# Patient Record
Sex: Female | Born: 2000 | Race: White | Hispanic: No | Marital: Married | Smoking: Never smoker
Health system: Southern US, Community
[De-identification: ages and names within clinical notes are randomized; demographics above are authoritative.]

## PROBLEM LIST (undated history)

## (undated) DIAGNOSIS — N200 Calculus of kidney: Secondary | ICD-10-CM

## (undated) HISTORY — DX: Calculus of kidney: N20.0

## (undated) HISTORY — PX: NO PAST SURGERIES: SHX2092

---
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
## 2018-07-09 ENCOUNTER — Other Ambulatory Visit: Payer: Self-pay

## 2018-07-09 ENCOUNTER — Emergency Department
Admission: EM | Admit: 2018-07-09 | Discharge: 2018-07-09 | Disposition: A | Discharge status: Home / Self Care | Payer: Self-pay | Attending: Emergency Medicine | Admitting: Emergency Medicine

## 2018-07-09 ENCOUNTER — Encounter: Payer: Self-pay | Admitting: Emergency Medicine

## 2018-07-09 DIAGNOSIS — Z3202 Encounter for pregnancy test, result negative: Secondary | ICD-10-CM | POA: Insufficient documentation

## 2018-07-09 DIAGNOSIS — N920 Excessive and frequent menstruation with regular cycle: Secondary | ICD-10-CM | POA: Insufficient documentation

## 2018-07-09 DIAGNOSIS — Z793 Long term (current) use of hormonal contraceptives: Secondary | ICD-10-CM | POA: Insufficient documentation

## 2018-07-09 LAB — BASIC METABOLIC PANEL
Anion gap: 7 (ref 5–15)
BUN: 9 mg/dL (ref 6–20)
CO2: 23 mmol/L (ref 22–32)
Calcium: 8.8 mg/dL — ABNORMAL LOW (ref 8.9–10.3)
Chloride: 106 mmol/L (ref 98–111)
Creatinine, Ser: 0.6 mg/dL (ref 0.44–1.00)
GFR calc non Af Amer: >60 mL/min (ref >60)
Glucose, Bld: 114 mg/dL — ABNORMAL HIGH (ref 70–99)
Potassium: 3.6 mmol/L (ref 3.5–5.1)
SODIUM: 136 mmol/L (ref 135–145)

## 2018-07-09 LAB — CBC WITH DIFFERENTIAL/PLATELET
Abs Immature Granulocytes: 0.02 10*3/uL (ref 0.00–0.07)
BASOS PCT: 0 %
Basophils Absolute: 0 10*3/uL (ref 0.0–0.1)
Eosinophils Absolute: 0.1 10*3/uL (ref 0.0–0.5)
Eosinophils Relative: 1 %
HCT: 40.3 % (ref 36.0–46.0)
Hemoglobin: 13.4 g/dL (ref 12.0–15.0)
Immature Granulocytes: 0 %
Lymphocytes Relative: 40 %
Lymphs Abs: 2 10*3/uL (ref 0.7–4.0)
MCH: 28.9 pg (ref 26.0–34.0)
MCHC: 33.3 g/dL (ref 30.0–36.0)
MCV: 86.9 fL (ref 80.0–100.0)
Monocytes Absolute: 0.4 10*3/uL (ref 0.1–1.0)
Monocytes Relative: 8 %
NRBC: 0 % (ref 0.0–0.2)
Neutro Abs: 2.5 10*3/uL (ref 1.7–7.7)
Neutrophils Relative %: 51 %
PLATELETS: 209 10*3/uL (ref 150–400)
RBC: 4.64 MIL/uL (ref 3.87–5.11)
RDW: 11.6 % (ref 11.5–15.5)
WBC: 5.1 10*3/uL (ref 4.0–10.5)

## 2018-07-09 LAB — POCT PREGNANCY, URINE: Preg Test, Ur: NEGATIVE

## 2018-07-09 NOTE — ED Provider Notes (Signed)
[facility] Emergency Department Provider Note   ____________________________________________    I have reviewed the triage vital signs and the nursing notes.   HISTORY  Chief Complaint Vaginal Bleeding     HPI Marissa Kennedy is a 17 y.o. female who presents with complaints of vaginal bleeding.  Patient reports she is currently menstruating but notes over the last several months her menstrual cycles have been heavier than she is used to and occasionally she passes clots.  She reports that she is on oral contraceptive pills and ever since she has been on them for the last several months she has had heavier bleeding.  No dizziness or lightheadedness or abdominal pain  Past Medical History:  Diagnosis Date  . Calculus of kidney 2017   DR. LARRY SYKES    There are no active problems to display for this patient.   Past Surgical History:  Procedure Laterality Date  . NO PAST SURGERIES      Prior to Admission medications   Medication Sig Start Date End Date Taking? Authorizing Provider  MedroxyPROGESTERone Acetate 150 MG/ML SUSY Inject 1 mL (150 mg total) into the muscle once. 02/10/17 02/10/17  Copland, Ilona SorrelAlicia B, PA-C     Allergies Patient has no known allergies.  Family History  Problem Relation Age of Onset  . Diabetes Father        TYPE 2    Social History Social History   Tobacco Use  . Smoking status: Never Smoker  . Smokeless tobacco: Never Used  Substance Use Topics  . Alcohol use: No  . Drug use: No    Review of Systems  Constitutional: No fever/chills Eyes: No visual changes.  ENT: No sore throat. Cardiovascular: Denies chest pain. Respiratory: Denies shortness of breath. Gastrointestinal: No abdominal pain.  No nausea, no vomiting.   Genitourinary: As above Musculoskeletal: Negative for back pain. Skin: Negative for rash. Neurological: Negative for headaches or  weakness   ____________________________________________   PHYSICAL EXAM:  VITAL SIGNS: ED Triage Vitals  Enc Vitals Group     BP 07/09/18 1152 125/83     Pulse Rate 07/09/18 1152 94     Resp --      Temp 07/09/18 1152 98.3 F (36.8 C)     Temp Source 07/09/18 1152 Oral     SpO2 07/09/18 1152 98 %     Weight 07/09/18 1153 70.3 kg (155 lb)     Height 07/09/18 1153 1.6 m (5\' 3" )     Head Circumference --      Peak Flow --      Pain Score 07/09/18 1154 7     Pain Loc --      Pain Edu? --      Excl. in GC? --     Constitutional: Alert and oriented. No acute distress.  Nose: No congestion/rhinnorhea. Mouth/Throat: Mucous membranes are moist.    Cardiovascular: Normal rate, regular rhythm.   Good peripheral circulation. Respiratory: Normal respiratory effort.  No retractions Gastrointestinal: Soft and nontender. No distention.  Musculoskeletal: No lower extremity tenderness nor edema.  Warm and well perfused Neurologic:  Normal speech and language. No gross focal neurologic deficits are appreciated.  Skin:  Skin is warm, dry and intact. No rash noted. Psychiatric: Mood and affect are normal. Speech and behavior are normal.  ____________________________________________   LABS (all labs ordered are listed, but only abnormal results are displayed)  Labs Reviewed  BASIC METABOLIC PANEL - Abnormal; Notable for the following  components:      Result Value   Glucose, Bld 114 (*)    Calcium 8.8 (*)    All other components within normal limits  CBC WITH DIFFERENTIAL/PLATELET  POCT PREGNANCY, URINE   ____________________________________________  EKG  None ____________________________________________  RADIOLOGY   ____________________________________________   PROCEDURES  Procedure(s) performed: No  Procedures   Critical Care performed: No ____________________________________________   INITIAL IMPRESSION / ASSESSMENT AND PLAN / ED COURSE  Pertinent labs &  imaging results that were available during my care of the patient were reviewed by me and considered in my medical decision making (see chart for details).  Patient presents with vaginal bleeding during her menstrual cycle.  Pregnancy is negative.  I recommend to the patient that she continue active OCPs instead of taking sugar pills to avoid withdrawal bleeds and follow-up with gynecologist   ____________________________________________   FINAL CLINICAL IMPRESSION(S) / ED DIAGNOSES  Final diagnoses:  Menorrhagia with regular cycle        Note:  This document was prepared using Dragon voice recognition software and may include unintentional dictation errors.    Jene EveryKinner, Rilee Knoll, MD 07/09/18 [phone]

## 2018-07-09 NOTE — ED Triage Notes (Signed)
Pt with heavy vaginal bleeding that started Monday, which was on schedule. Flow increased last night. Pt has used 5 pads since 0300.

## 2018-07-09 NOTE — ED Notes (Signed)
Dr Kinner in to evaluate patient 

## 2018-07-10 ENCOUNTER — Ambulatory Visit (INDEPENDENT_AMBULATORY_CARE_PROVIDER_SITE_OTHER): Payer: Self-pay | Admitting: Obstetrics and Gynecology

## 2018-07-10 ENCOUNTER — Encounter: Payer: Self-pay | Admitting: Obstetrics and Gynecology

## 2018-07-10 VITALS — BP 80/50 | HR 73 | Ht 63.0 in | Wt 157.0 lb

## 2018-07-10 DIAGNOSIS — N920 Excessive and frequent menstruation with regular cycle: Secondary | ICD-10-CM

## 2018-07-10 NOTE — Progress Notes (Signed)
Patient ID: [place] Lions[place]ackenna [place], female   DOB: [birth year], 17 y.o.   MRN: [id number]  Reason for Consult: Follow-up (pelvic pain is still there but not as bad as it was,right now is on the left side only, before it was entire pelvic area, on the 23rd in about 6 hrs changed tampon and pad 5 times )   Referred by No ref. provider found  Subjective:     HPI:  Marissa Kennedy is a 17 y.o. female . She reports that for 3 months she has been having heavy bleeding during her period. She says that the other night she woke up with blood streaming down her leg and she felt it pouring out like she was peeing on herself. She uses tampons and pads. She had to change them every hour for 6 hours. It took her a long time to go to the hospital but when she went she was found to not be pregnant and to have a normal CBC. She denies feeling light headed or dizzy. Her bleeding has lessened and slowed on its own. This bleeding occurred at the normal time for her period.  She lost insurance a year ago. She was taking depo but [place]  She started 6 months ago receiving birth control through an app on her phone. She has been receiving microgestin 1.5/30  Menarche 9 or 10, normal regular periods, no heavy bleeding, once had a episode of pain but she took midol and it was better.  She is sexually active, denies dyspareunia.    Past Medical History:  Diagnosis Date  . Calculus of kidney 2017   DR. LARRY SYKES   Family History  Problem Relation Age of Onset  . Diabetes Father        TYPE 2   Past Surgical History:  Procedure Laterality Date  . NO PAST SURGERIES      Short Social History:  Social History   Tobacco Use  . Smoking status: Never Smoker  . Smokeless tobacco: Never Used  Substance Use Topics  . Alcohol use: No    No Known Allergies  No current outpatient medications on file.   No current facility-administered medications for this visit.     Review of  Systems  Constitutional: Negative for chills, fatigue, fever and unexpected weight change.  HENT: Negative for trouble swallowing.  Eyes: Negative for loss of vision.  Respiratory: Negative for cough, shortness of breath and wheezing.  Cardiovascular: Negative for chest pain, leg swelling, palpitations and syncope.  GI: Negative for abdominal pain, blood in stool, diarrhea, nausea and vomiting.  GU: Negative for difficulty urinating, dysuria, frequency and hematuria.  Musculoskeletal: Negative for back pain, leg pain and joint pain.  Skin: Negative for rash.  Neurological: Negative for dizziness, headaches, light-headedness, numbness and seizures.  Psychiatric: Negative for behavioral problem, confusion, depressed mood and sleep disturbance.        Objective:  Objective   Vitals:   07/10/18 1128  BP: (!) 80/50  Pulse: 73  Weight: 157 lb (71.2 kg)  Height: 5\' 3"  (1.6 m)   Body mass index is 27.81 kg/m.  Physical Exam Vitals signs and nursing note reviewed.  Constitutional:      Appearance: She is well-developed.  HENT:     Head: Normocephalic and atraumatic.  Eyes:     Pupils: Pupils are equal, round, and reactive to light.  Cardiovascular:     Rate and Rhythm: Normal rate and regular rhythm.  Pulmonary:     Effort: Pulmonary effort is normal. No respiratory distress.  Genitourinary:    General: Normal vulva.     Comments: Normal cervix, minimal bleeding, normal 7cm uterus, no CMT, no adnexal masses or adnexal tenderness on examination Skin:    General: Skin is warm and dry.  Neurological:     Mental Status: She is alert and oriented to person, place, and time.  Psychiatric:        Behavior: Behavior normal.        Thought Content: Thought content normal.        Judgment: Judgment normal.        Assessment/Plan:    17 yo with menorrhagia  Given a 3 month supply of Taytulla. Will see if this medication helps improve her menses. 24 days of active pills and lower  amounts of progesterone and estrogen component.  We discussed other option for cycle control such as an IUD, Nexplanon, or depo. Discussed that these would be available to her through the health department at a reduced cost. She is not currently interested in that option, although she may consider.   She will follow up for an ultrasound and a return visit in 3 months if there has been no improvement.   More than 45 minutes were spent face to face with the patient in the room with more than 50% of the time spent providing counseling and discussing the plan of management.    Adelene Idlerhristanna Courtnay Petrilla MD [facility], [facility] 07/10/2018 12:39 PM

## 2018-08-19 ENCOUNTER — Other Ambulatory Visit: Payer: Self-pay

## 2018-08-19 DIAGNOSIS — I880 Nonspecific mesenteric lymphadenitis: Secondary | ICD-10-CM | POA: Insufficient documentation

## 2018-08-19 DIAGNOSIS — Z79899 Other long term (current) drug therapy: Secondary | ICD-10-CM | POA: Insufficient documentation

## 2018-08-19 LAB — URINALYSIS, COMPLETE (UACMP) WITH MICROSCOPIC
Bilirubin Urine: NEGATIVE
Glucose, UA: NEGATIVE mg/dL
Ketones, ur: NEGATIVE mg/dL
Leukocytes, UA: NEGATIVE
Nitrite: NEGATIVE
Protein, ur: NEGATIVE mg/dL
Specific Gravity, Urine: 1.01 (ref 1.005–1.030)
pH: 7 (ref 5.0–8.0)

## 2018-08-19 LAB — COMPREHENSIVE METABOLIC PANEL
ALT: 15 U/L (ref 0–44)
AST: 20 U/L (ref 15–41)
Albumin: 4 g/dL (ref 3.5–5.0)
Alkaline Phosphatase: 29 U/L — ABNORMAL LOW (ref 38–126)
Anion gap: 5 (ref 5–15)
BUN: 6 mg/dL (ref 6–20)
CO2: 26 mmol/L (ref 22–32)
Calcium: 8.3 mg/dL — ABNORMAL LOW (ref 8.9–10.3)
Chloride: 103 mmol/L (ref 98–111)
Creatinine, Ser: 0.59 mg/dL (ref 0.44–1.00)
GFR calc Af Amer: >60 mL/min (ref >60)
GFR calc non Af Amer: >60 mL/min (ref >60)
Glucose, Bld: 99 mg/dL (ref 70–99)
Potassium: 3.3 mmol/L — ABNORMAL LOW (ref 3.5–5.1)
Sodium: 134 mmol/L — ABNORMAL LOW (ref 135–145)
Total Bilirubin: 0.4 mg/dL (ref 0.3–1.2)
Total Protein: 6.9 g/dL (ref 6.5–8.1)

## 2018-08-19 LAB — CBC
HCT: 37.6 % (ref 36.0–46.0)
Hemoglobin: 13 g/dL (ref 12.0–15.0)
MCH: 29.3 pg (ref 26.0–34.0)
MCHC: 34.6 g/dL (ref 30.0–36.0)
MCV: 84.7 fL (ref 80.0–100.0)
PLATELETS: 216 10*3/uL (ref 150–400)
RBC: 4.44 MIL/uL (ref 3.87–5.11)
RDW: 11.4 % — ABNORMAL LOW (ref 11.5–15.5)
WBC: 6 10*3/uL (ref 4.0–10.5)
nRBC: 0 % (ref 0.0–0.2)

## 2018-08-19 LAB — POCT PREGNANCY, URINE: Preg Test, Ur: NEGATIVE

## 2018-08-19 NOTE — ED Triage Notes (Signed)
Pt in with co mid abd pain and back pain. States has hx of kidney stones and feels the same. Denies any dysuria.

## 2018-08-20 ENCOUNTER — Emergency Department
Admission: EM | Admit: 2018-08-20 | Discharge: 2018-08-20 | Disposition: A | Discharge status: Home / Self Care | Payer: Self-pay | Attending: Emergency Medicine | Admitting: Emergency Medicine

## 2018-08-20 ENCOUNTER — Emergency Department: Payer: Self-pay

## 2018-08-20 DIAGNOSIS — I880 Nonspecific mesenteric lymphadenitis: Secondary | ICD-10-CM

## 2018-08-20 LAB — LIPASE, BLOOD: Lipase: 25 U/L (ref 11–51)

## 2018-08-20 MED ORDER — IOPAMIDOL (ISOVUE-300) INJECTION 61%
100.0000 mL | Freq: Once | INTRAVENOUS | Status: AC | PRN
  Administered 2018-08-20: 100 mL via INTRAVENOUS

## 2018-08-20 NOTE — ED Provider Notes (Signed)
[facility] Emergency Department Provider Note __   First MD Initiated Contact with Patient 08/20/18 0304     (approximate)  I have reviewed the triage vital signs and the nursing notes.   HISTORY  Chief Complaint Abdominal Pain   HPI Marissa Kennedy is a 18 y.o. female with previous history of kidney stones.  Presents to the emergency department acute onset of generalized abdominal discomfort described as cramping which began at 4:30 PM.  Patient admits to nausea however no vomiting no diarrhea constipation.  Patient denies any fever.   Past Medical History:  Diagnosis Date  . Calculus of kidney 2017   DR. LARRY SYKES    There are no active problems to display for this patient.   Past Surgical History:  Procedure Laterality Date  . NO PAST SURGERIES      Prior to Admission medications   Medication Sig Start Date End Date Taking? Authorizing Provider  MICROGESTIN FE 1.5/30 1.5-30 MG-MCG tablet TAKE 1 TABLET BY MOUTH DAILY AT THE SAME TIME 05/08/18   [provider]    Allergies Patient has no known allergies.  Family History  Problem Relation Age of Onset  . Diabetes Father        TYPE 2    Social History Social History   Tobacco Use  . Smoking status: Never Smoker  . Smokeless tobacco: Never Used  Substance Use Topics  . Alcohol use: No  . Drug use: No    Review of Systems Constitutional: No fever/chills Eyes: No visual changes. ENT: No sore throat. Cardiovascular: Denies chest pain. Respiratory: Denies shortness of breath. Gastrointestinal: Positive for abdominal pain.  No nausea, no vomiting.  No diarrhea.  No constipation. Genitourinary: Negative for dysuria. Musculoskeletal: Negative for neck pain.  Negative for back pain. Integumentary: Negative for rash. Neurological: Negative for headaches, focal weakness or numbness.   ____________________________________________   PHYSICAL EXAM:  VITAL  SIGNS: ED Triage Vitals [08/19/18 2209]  Enc Vitals Group     BP 120/69     Pulse Rate (!) 118     Resp 20     Temp 99 F (37.2 C)     Temp Source Oral     SpO2 99 %     Weight 72.6 kg (160 lb)     Height 1.6 m (5\' 3" )     Head Circumference      Peak Flow      Pain Score 7     Pain Loc      Pain Edu?      Excl. in GC?     Constitutional: Alert and oriented. Well appearing and in no acute distress. Eyes: Conjunctivae are normal.  Mouth/Throat: Mucous membranes are moist. Oropharynx non-erythematous. Neck: No stridor.   Cardiovascular: Normal rate, regular rhythm. Good peripheral circulation. Grossly normal heart sounds. Respiratory: Normal respiratory effort.  No retractions. Lungs CTAB. Gastrointestinal: Soft and nontender. No distention.  Musculoskeletal: No lower extremity tenderness nor edema. No gross deformities of extremities. Neurologic:  Normal speech and language. No gross focal neurologic deficits are appreciated.  Skin:  Skin is warm, dry and intact. No rash noted. Psychiatric: Mood and affect are normal. Speech and behavior are normal.  ____________________________________________   LABS (all labs ordered are listed, but only abnormal results are displayed)  Labs Reviewed  CBC - Abnormal; Notable for the following components:      Result Value   RDW 11.4 (*)    All other components within  normal limits  COMPREHENSIVE METABOLIC PANEL - Abnormal; Notable for the following components:   Sodium 134 (*)    Potassium 3.3 (*)    Calcium 8.3 (*)    Alkaline Phosphatase 29 (*)    All other components within normal limits  URINALYSIS, COMPLETE (UACMP) WITH MICROSCOPIC - Abnormal; Notable for the following components:   Color, Urine YELLOW (*)    APPearance CLEAR (*)    Hgb urine dipstick SMALL (*)    Bacteria, UA RARE (*)    All other components within normal limits  LIPASE, BLOOD  POC URINE PREG, ED  POCT PREGNANCY, URINE    ______________________  RADIOLOGY I, Guadalupe Guerra N Mardel Grudzien, personally viewed and evaluated these images (plain radiographs) as part of my medical decision making, as well as reviewing the written report by the radiologist.  ED MD interpretation: No appendicitis urolithiasis or any other definitive acute findings noted on CT scan of the abdomen pelvis.    Official radiology report(s): Ct Abdomen Pelvis W Contrast  Result Date: 08/20/2018 CLINICAL DATA:  Mid abdominal pain and back pain EXAM: CT ABDOMEN AND PELVIS WITH CONTRAST TECHNIQUE: Multidetector CT imaging of the abdomen and pelvis was performed using the standard protocol following bolus administration of intravenous contrast. CONTRAST:  ISOVUE-300 IOPAMIDOL (ISOVUE-300) INJECTION 61% COMPARISON:  None. FINDINGS: Lower chest:  Negative Hepatobiliary: No focal liver abnormality.No evidence of biliary obstruction or stone. Pancreas: Unremarkable. Spleen: Unremarkable. Adrenals/Urinary Tract: Negative adrenals. No hydronephrosis or stone. Unremarkable bladder. Stomach/Bowel:  No obstruction. No appendicitis. Vascular/Lymphatic: No acute vascular abnormality. Generalized prominence of mesenteric lymph nodes without associated bowel wall thickening or masslike findings. Reproductive:No pathologic findings. Other: Trace pelvic fluid, often physiologic. Musculoskeletal: No acute abnormalities. IMPRESSION: 1. No appendicitis, urolithiasis, or other definite acute finding. 2. Reactive appearing prominence of mesenteric lymph nodes, please correlate for enteritis symptoms. 3. Trace pelvic fluid, usually physiologic. Electronically Signed   By: Marnee Spring M.D.   On: 08/20/2018 04:43      Procedures   ____________________________________________   INITIAL IMPRESSION / ASSESSMENT AND PLAN / ED COURSE  As part of my medical decision making, I reviewed the following data within the electronic MEDICAL RECORD NUMBER   18 year old female present  with above-stated history and physical exam raising possibility of appendicitis diverticulitis ureterolithiasis versus other potential intra-abdominal pathology.  CT scan revealed no acute process however did reveal mesenteric lymph nodes in the setting of a normal white count.  Suspect mesenteric adenitis as the etiology for the patient's discomfort. ____________________________________________  FINAL CLINICAL IMPRESSION(S) / ED DIAGNOSES  Final diagnoses:  Mesenteric adenitis     MEDICATIONS GIVEN DURING THIS VISIT:  Medications  iopamidol (ISOVUE-300) 61 % injection 100 mL (100 mLs Intravenous Contrast Given 08/20/18 0355)     ED Discharge Orders    None       Note:  This document was prepared using Dragon voice recognition software and may include unintentional dictation errors.   Darci Current, MD 08/20/18 [id number]

## 2018-09-22 ENCOUNTER — Telehealth: Payer: Self-pay

## 2018-09-22 ENCOUNTER — Other Ambulatory Visit: Payer: Self-pay | Admitting: Obstetrics and Gynecology

## 2018-09-22 DIAGNOSIS — Z3009 Encounter for other general counseling and advice on contraception: Secondary | ICD-10-CM

## 2018-09-22 MED ORDER — LEVONORGEST-ETH ESTRAD 91-DAY 0.15-0.03 &0.01 MG PO TABS: 1.0000 | ORAL_TABLET | Freq: Every day | ORAL | 4 refills | Status: DC

## 2018-09-22 NOTE — Telephone Encounter (Signed)
Please advise 

## 2018-09-22 NOTE — Telephone Encounter (Signed)
Pt c/o waking up this morning bleeding.  Periods have been pretty regular c bcp.  Has appt 4/2. Wants to discuss this c someone.  She doesn't have ins and doesn't want to come in until appt on 4/2.  [phone]  Pt hasn't been late taking pill or missed any pills.  She is about a week away from when she is supposed to have period.  Bleeding started out as red, then pink, now pinkish brown.  Is not saturating pad q71min - 1hr but flow does fluctuate.  Adv I would send msg to CRS as I didn't have an answer for her.

## 2018-09-22 NOTE — Telephone Encounter (Signed)
Patient has been happy with Taytulla. Does not have insurance. Has not registered for medicaid or family planning, encouraged her to do this. She says her husband may have insurance in several months with his job. Bleeding has slowed. Recommended continuing with pill pack. This is her last pack of samples she was given. She has been happy with the taytulla. Sent rx for affordable generic medication for extended cycle OCP. Follow up as need. -Eliam Snapp

## 2018-10-09 ENCOUNTER — Ambulatory Visit: Payer: Self-pay | Admitting: Obstetrics and Gynecology

## 2018-10-09 ENCOUNTER — Other Ambulatory Visit: Payer: Self-pay

## 2018-10-12 ENCOUNTER — Other Ambulatory Visit: Payer: Self-pay | Admitting: Obstetrics and Gynecology

## 2018-10-15 ENCOUNTER — Ambulatory Visit: Payer: Self-pay | Admitting: Obstetrics and Gynecology

## 2018-10-15 ENCOUNTER — Ambulatory Visit: Payer: Self-pay

## 2018-10-26 ENCOUNTER — Other Ambulatory Visit: Payer: Self-pay | Admitting: Obstetrics and Gynecology

## 2018-10-26 DIAGNOSIS — N920 Excessive and frequent menstruation with regular cycle: Secondary | ICD-10-CM

## 2018-11-12 ENCOUNTER — Ambulatory Visit: Payer: Self-pay | Admitting: Obstetrics and Gynecology

## 2018-11-12 ENCOUNTER — Other Ambulatory Visit: Payer: Self-pay

## 2018-12-15 ENCOUNTER — Telehealth: Payer: Self-pay

## 2018-12-15 NOTE — Telephone Encounter (Signed)
Pt needs refill of bcp; has no insurance so she can't come in for annual if we can point her in the right direction about that. [phone]

## 2018-12-16 ENCOUNTER — Other Ambulatory Visit: Payer: Self-pay

## 2018-12-16 DIAGNOSIS — Z3009 Encounter for other general counseling and advice on contraception: Secondary | ICD-10-CM

## 2018-12-16 MED ORDER — LEVONORGEST-ETH ESTRAD 91-DAY 0.15-0.03 &0.01 MG PO TABS: 1.0000 | ORAL_TABLET | Freq: Every day | ORAL | 0 refills | Status: DC

## 2018-12-16 NOTE — Telephone Encounter (Signed)
Pt aware of RF sent to pharmacy.

## 2018-12-16 NOTE — Telephone Encounter (Signed)
Called and spoke to patient. Advise appointment is 132.75 for Self pay. Patient is schedule for 12/24/18 with [facility] requesting refill on Birth Control to get to her schedule appointment

## 2018-12-24 ENCOUNTER — Ambulatory Visit: Payer: Self-pay | Admitting: Obstetrics and Gynecology

## 2019-01-25 ENCOUNTER — Ambulatory Visit: Payer: Self-pay | Admitting: Obstetrics and Gynecology

## 2019-01-29 ENCOUNTER — Ambulatory Visit: Payer: Self-pay | Admitting: Obstetrics and Gynecology

## 2019-11-03 NOTE — Progress Notes (Signed)
Chief Complaint  Patient presents with  . Gynecologic Exam     HPI:      [name] is a 19 y.o. G0P0000 who LMP was Patient's last menstrual period was 10/30/2019 (exact date)., presents today for her annual examination.  Her menses are regular every 28-30 days, lasting 4-5 days.  Dysmenorrhea mild, takes NSAIDs with relief. She does not have intermenstrual bleeding.  Sex activity: single partner, contraception - none. Conception ok. Just started PNVs. Keeps getting positive home UPTs if a few days late for period and then has regular menses. Did OCPs in past but didn't like how they made her feel.  Hx of STDs: none  Had episode of severe pelvic pain lasting several hrs about 5 days ago. Sx were on day 1 of period. Went to ED. Had neg serum beta HcG, neg labs. Offered u/s but pt declined because sx resolved. Had had a pos home UPT a few days before bleeding and then subsequent neg UPTs.   There is no FH of breast cancer. There is no FH of ovarian cancer. The patient does not do self-breast exams.  Tobacco use: The patient denies current or previous tobacco use. Alcohol use: social No drug use Exercise: moderately active  She does get adequate calcium and Vitamin D in her diet.  Gard completed.  Past Medical History:  Diagnosis Date  . Calculus of kidney 2017   DR. Zara Chess    Past Surgical History:  Procedure Laterality Date  . NO PAST SURGERIES      Family History  Problem Relation Age of Onset  . Diabetes Father        TYPE 2    Social History   Socioeconomic History  . Marital status: Single    Spouse name: Not on file  . Number of children: 0  . Years of education: 54  . Highest education level: Not on file  Occupational History  . Not on file  Tobacco Use  . Smoking status: Never Smoker  . Smokeless tobacco: Never Used  Substance and Sexual Activity  . Alcohol use: No  . Drug use: No  . Sexual activity: Yes    Birth  control/protection: None  Other Topics Concern  . Not on file  Social History Narrative  . Not on file   Social Determinants of Health   Financial Resource Strain:   . Difficulty of Paying Living Expenses:   Food Insecurity:   . Worried About Programme researcher, broadcasting/film/video in the Last Year:   . Barista in the Last Year:   Transportation Needs:   . Freight forwarder (Medical):   Marland Kitchen Lack of Transportation (Non-Medical):   Physical Activity:   . Days of Exercise per Week:   . Minutes of Exercise per Session:   Stress:   . Feeling of Stress :   Social Connections:   . Frequency of Communication with Friends and Family:   . Frequency of Social Gatherings with Friends and Family:   . Attends Religious Services:   . Active Member of Clubs or Organizations:   . Attends Banker Meetings:   Marland Kitchen Marital Status:   Intimate Partner Violence:   . Fear of Current or Ex-Partner:   . Emotionally Abused:   Marland Kitchen Physically Abused:   . Sexually Abused:     No current outpatient medications on file.  ROS:  Review of Systems  Constitutional: Positive for fatigue. Negative for fever and  unexpected weight change.  Respiratory: Negative for cough, shortness of breath and wheezing.   Cardiovascular: Negative for chest pain, palpitations and leg swelling.  Gastrointestinal: Negative for blood in stool, constipation, diarrhea, nausea and vomiting.  Endocrine: Negative for cold intolerance, heat intolerance and polyuria.  Genitourinary: Negative for dyspareunia, dysuria, flank pain, frequency, genital sores, hematuria, menstrual problem, pelvic pain, urgency, vaginal bleeding, vaginal discharge and vaginal pain.  Musculoskeletal: Negative for back pain, joint swelling and myalgias.  Skin: Negative for rash.  Neurological: Positive for headaches. Negative for dizziness, syncope, light-headedness and numbness.  Hematological: Negative for adenopathy.  Psychiatric/Behavioral: Negative for  agitation, confusion, sleep disturbance and suicidal ideas. The patient is not nervous/anxious.      Objective: BP 90/64   Ht 5\' 3"  (1.6 m)   Wt 169 lb (76.7 kg)   LMP 10/30/2019 (Exact Date)   BMI 29.94 kg/m    Physical Exam Constitutional:      Appearance: She is well-developed.  Genitourinary:     Vulva, vagina, uterus, right adnexa and left adnexa normal.     No vulval lesion or tenderness noted.     No vaginal discharge, erythema or tenderness.     No cervical motion tenderness or polyp.     Uterus is not enlarged or tender.     No right or left adnexal mass present.     Right adnexa not tender.     Left adnexa not tender.  Neck:     Thyroid: No thyromegaly.  Cardiovascular:     Rate and Rhythm: Normal rate and regular rhythm.     Heart sounds: Normal heart sounds. No murmur.  Pulmonary:     Effort: Pulmonary effort is normal.     Breath sounds: Normal breath sounds.  Chest:     Breasts:        Right: No mass, nipple discharge, skin change or tenderness.        Left: No mass, nipple discharge, skin change or tenderness.  Abdominal:     Palpations: Abdomen is soft.     Tenderness: There is no abdominal tenderness. There is no guarding.  Musculoskeletal:        General: Normal range of motion.     Cervical back: Normal range of motion.  Neurological:     General: No focal deficit present.     Mental Status: She is alert and oriented to person, place, and time.     Cranial Nerves: No cranial nerve deficit.  Skin:    General: Skin is warm and dry.  Psychiatric:        Mood and Affect: Mood normal.        Behavior: Behavior normal.        Thought Content: Thought content normal.        Judgment: Judgment normal.  Vitals reviewed.     Assessment/Plan: Encounter for annual routine gynecological examination  Screening for STD (sexually transmitted disease) - Plan: Cervicovaginal ancillary only  Pre-conception counseling--cont PNVs. F/u prn NOB. Only take  UPT if menses 1-2 wks late. Something in her urine giving her false positives. If has pos UPT and truly late menses, will check serum beta HcG.   Pelvic pain--last wknd, with menses, resolved. F/u prn. Can check GYN u/s if sx persist. Most likely was menstrual-related.             GYN counsel adequate intake of calcium and vitamin D     F/U  Return in about 1 year (  around 11/03/2020).  Alley Neils B. Sheli Dorin, PA-C 11/04/2019 9:28 AM

## 2019-11-03 NOTE — Patient Instructions (Signed)
I value your feedback and entrusting us with your care. If you get a [facility] patient survey, I would appreciate you taking the time to let us know about your experience today. Thank you!  As of June 24, 2019, your lab results will be released to your MyChart immediately, before I even have a chance to see them. Please give me time to review them and contact you if there are any abnormalities. Thank you for your patience.  

## 2019-11-04 ENCOUNTER — Ambulatory Visit (INDEPENDENT_AMBULATORY_CARE_PROVIDER_SITE_OTHER): Payer: 59 | Admitting: Obstetrics and Gynecology

## 2019-11-04 ENCOUNTER — Other Ambulatory Visit: Payer: Self-pay

## 2019-11-04 ENCOUNTER — Other Ambulatory Visit (HOSPITAL_COMMUNITY)
Admission: RE | Admit: 2019-11-04 | Discharge: 2019-11-04 | Disposition: A | Discharge status: Home / Self Care | Payer: 59 | Source: Ambulatory Visit | Attending: Obstetrics and Gynecology | Admitting: Obstetrics and Gynecology

## 2019-11-04 ENCOUNTER — Encounter: Payer: Self-pay | Admitting: Obstetrics and Gynecology

## 2019-11-04 VITALS — BP 90/64 | Ht 63.0 in | Wt 169.0 lb

## 2019-11-04 DIAGNOSIS — Z3169 Encounter for other general counseling and advice on procreation: Secondary | ICD-10-CM | POA: Diagnosis not present

## 2019-11-04 DIAGNOSIS — Z01419 Encounter for gynecological examination (general) (routine) without abnormal findings: Secondary | ICD-10-CM

## 2019-11-04 DIAGNOSIS — R102 Pelvic and perineal pain: Secondary | ICD-10-CM

## 2019-11-04 DIAGNOSIS — Z113 Encounter for screening for infections with a predominantly sexual mode of transmission: Secondary | ICD-10-CM | POA: Diagnosis not present

## 2019-11-05 LAB — CERVICOVAGINAL ANCILLARY ONLY
Chlamydia: NEGATIVE
Comment: NEGATIVE
Comment: NORMAL
Neisseria Gonorrhea: NEGATIVE

## 2019-12-02 ENCOUNTER — Encounter: Payer: Self-pay | Admitting: Obstetrics and Gynecology

## 2020-02-07 ENCOUNTER — Encounter: Payer: Self-pay | Admitting: Obstetrics and Gynecology

## 2020-03-21 ENCOUNTER — Encounter: Payer: Self-pay | Admitting: Obstetrics and Gynecology

## 2020-03-29 ENCOUNTER — Other Ambulatory Visit: Payer: Self-pay

## 2020-03-29 ENCOUNTER — Other Ambulatory Visit (HOSPITAL_COMMUNITY)
Admission: RE | Admit: 2020-03-29 | Discharge: 2020-03-29 | Disposition: A | Discharge status: Home / Self Care | Payer: 59 | Source: Ambulatory Visit | Attending: Advanced Practice Midwife | Admitting: Advanced Practice Midwife

## 2020-03-29 ENCOUNTER — Encounter: Payer: Self-pay | Admitting: Advanced Practice Midwife

## 2020-03-29 ENCOUNTER — Ambulatory Visit (INDEPENDENT_AMBULATORY_CARE_PROVIDER_SITE_OTHER): Payer: 59 | Admitting: Advanced Practice Midwife

## 2020-03-29 VITALS — BP 120/77 | HR 88 | Wt 162.0 lb

## 2020-03-29 DIAGNOSIS — Z113 Encounter for screening for infections with a predominantly sexual mode of transmission: Secondary | ICD-10-CM | POA: Insufficient documentation

## 2020-03-29 DIAGNOSIS — Z124 Encounter for screening for malignant neoplasm of cervix: Secondary | ICD-10-CM | POA: Diagnosis present

## 2020-03-29 DIAGNOSIS — Z3401 Encounter for supervision of normal first pregnancy, first trimester: Secondary | ICD-10-CM

## 2020-03-29 NOTE — Patient Instructions (Signed)
Exercise During Pregnancy Exercise is an important part of being healthy for people of all ages. Exercise improves the function of your heart and lungs and helps you maintain strength, flexibility, and a healthy body weight. Exercise also boosts energy levels and elevates mood. Most women should exercise regularly during pregnancy. In rare cases, women with certain medical conditions or complications may be asked to limit or avoid exercise during pregnancy. How does this affect me? Along with maintaining general strength and flexibility, exercising during pregnancy can help:  Keep strength in muscles that are used during labor and childbirth.  Decrease low back pain.  Reduce symptoms of depression.  Control weight gain during pregnancy.  Reduce the risk of needing insulin if you develop diabetes during pregnancy.  Decrease the risk of cesarean delivery.  Speed up your recovery after giving birth. How does this affect my baby? Exercise can help you have a healthy pregnancy. Exercise does not cause premature birth. It will not cause your baby to weigh less at birth. What exercises can I do? Many exercises are safe for you to do during pregnancy. Do a variety of exercises that safely increase your heart and breathing rates and help you build and maintain muscle strength. Do exercises exactly as told by your health care provider. You may do these exercises:  Walking or hiking.  Swimming.  Water aerobics.  Riding a stationary bike.  Strength training.  Modified yoga or Pilates. Tell your instructor that you are pregnant. Avoid overstretching, and avoid lying on your back for long periods of time.  Running or jogging. Only choose this type of exercise if you: ? Ran or jogged regularly before your pregnancy. ? Can run or jog and still talk in complete sentences. What exercises should I avoid? Depending on your level of fitness and whether you exercised regularly before your  pregnancy, you may be told to limit high-intensity exercise. You can tell that you are exercising at a high intensity if you are breathing much harder and faster and cannot hold a conversation while exercising. You must avoid:  Contact sports.  Activities that put you at risk for falling on or being hit in the belly, such as downhill skiing, water skiing, surfing, rock climbing, cycling, gymnastics, and horseback riding.  Scuba diving.  Skydiving.  Yoga or Pilates in a room that is heated to high temperatures.  Jogging or running, unless you ran or jogged regularly before your pregnancy. While jogging or running, you should always be able to talk in full sentences. Do not run or jog so fast that you are unable to have a conversation.  Do not exercise at more than 6,000 feet above sea level (high elevation) if you are not used to exercising at high elevation. How do I exercise in a safe way?   Avoid overheating. Do not exercise in very high temperatures.  Wear loose-fitting, breathable clothes.  Avoid dehydration. Drink enough water before, during, and after exercise to keep your urine pale yellow.  Avoid overstretching. Because of hormone changes during pregnancy, it is easy to overstretch muscles, tendons, and ligaments during pregnancy.  Start slowly and ask your health care provider to recommend the types of exercise that are safe for you.  Do not exercise to lose weight. Follow these instructions at home:  Exercise on most days or all days of the week. Try to exercise for 30 minutes a day, 5 days a week, unless your health care provider tells you not to.  If   you actively exercised before your pregnancy and you are healthy, your health care provider may tell you to continue to do moderate to high-intensity exercise.  If you are just starting to exercise or did not exercise much before your pregnancy, your health care provider may tell you to do low to moderate-intensity  exercise. Questions to ask your health care provider  Is exercise safe for me?  What are signs that I should stop exercising?  Does my health condition mean that I should not exercise during pregnancy?  When should I avoid exercising during pregnancy? Stop exercising and contact a health care provider if: You have any unusual symptoms, such as:  Mild contractions of the uterus or cramps in the abdomen.  Dizziness that does not go away when you rest. Stop exercising and get help right away if: You have any unusual symptoms, such as:  Sudden, severe pain in your low back or your belly.  Mild contractions of the uterus or cramps in the abdomen that do not improve with rest and drinking fluids.  Chest pain.  Bleeding or fluid leaking from your vagina.  Shortness of breath. These symptoms may represent a serious problem that is an emergency. Do not wait to see if the symptoms will go away. Get medical help right away. Call your local emergency services (911 in the [place] Do not drive yourself to the hospital. Summary  Most women should exercise regularly throughout pregnancy. In rare cases, women with certain medical conditions or complications may be asked to limit or avoid exercise during pregnancy.  Do not exercise to lose weight during pregnancy.  Your health care provider will tell you what level of physical activity is right for you.  Stop exercising and contact a health care provider if you have mild contractions of the uterus or cramps in the abdomen. Get help right away if these contractions or cramps do not improve with rest and drinking fluids.  Stop exercising and get help right away if you have sudden, severe pain in your low back or belly, chest pain, shortness of breath, or bleeding or leaking of fluid from your vagina. This information is not intended to replace advice given to you by your health care provider. Make sure you discuss any questions you have with your  health care provider. Document Revised: 10/22/2018 Document Reviewed: 08/05/2018 Elsevier Patient Education  2020 [employer] Eating Plan for Pregnant Women While you are pregnant, your body requires additional nutrition to help support your growing baby. You also have a higher need for some vitamins and minerals, such as folic acid, calcium, iron, and vitamin D. Eating a healthy, well-balanced diet is very important for your health and your baby's health. Your need for extra calories varies for the three 3-month segments of your pregnancy (trimesters). For most women, it is recommended to consume:  150 extra calories a day during the first trimester.  300 extra calories a day during the second trimester.  300 extra calories a day during the third trimester. What are tips for following this plan?   Do not try to lose weight or go on a diet during pregnancy.  Limit your overall intake of foods that have "empty calories." These are foods that have little nutritional value, such as sweets, desserts, candies, and sugar-sweetened beverages.  Eat a variety of foods (especially fruits and vegetables) to get a full range of vitamins and minerals.  Take a prenatal vitamin to help meet your additional vitamin and mineral needs   during pregnancy, specifically for folic acid, iron, calcium, and vitamin D.  Remember to stay active. Ask your health care provider what types of exercise and activities are safe for you.  Practice good food safety and cleanliness. Wash your hands before you eat and after you prepare raw meat. Wash all fruits and vegetables well before peeling or eating. Taking these actions can help to prevent food-borne illnesses that can be very dangerous to your baby, such as listeriosis. Ask your health care provider for more information about listeriosis. What does 150 extra calories look like? Healthy options that provide 150 extra calories each day could be any of the  following:  6-8 oz (170-230 g) of plain low-fat yogurt with  cup of berries.  1 apple with 2 teaspoons (11 g) of peanut butter.  Cut-up vegetables with  cup (60 g) of hummus.  8 oz (230 mL) or 1 cup of low-fat chocolate milk.  1 stick of string cheese with 1 medium orange.  1 peanut butter and jelly sandwich that is made with one slice of whole-wheat bread and 1 tsp (5 g) of peanut butter. For 300 extra calories, you could eat two of those healthy options each day. What is a healthy amount of weight to gain? The right amount of weight gain for you is based on your BMI before you became pregnant. If your BMI:  Was less than 18 (underweight), you should gain 28-40 lb (13-18 kg).  Was 18-24.9 (normal), you should gain 25-35 lb (11-16 kg).  Was 25-29.9 (overweight), you should gain 15-25 lb (7-11 kg).  Was 30 or greater (obese), you should gain 11-20 lb (5-9 kg). What if I am having twins or multiples? Generally, if you are carrying twins or multiples:  You may need to eat 300-600 extra calories a day.  The recommended range for total weight gain is 25-54 lb (11-25 kg), depending on your BMI before pregnancy.  Talk with your health care provider to find out about nutritional needs, weight gain, and exercise that is right for you. What foods can I eat?  Fruits All fruits. Eat a variety of colors and types of fruit. Remember to wash your fruits well before peeling or eating. Vegetables All vegetables. Eat a variety of colors and types of vegetables. Remember to wash your vegetables well before peeling or eating. Grains All grains. Choose whole grains, such as whole-wheat bread, oatmeal, or brown rice. Meats and other protein foods Lean meats, including chicken, turkey, fish, and lean cuts of beef, veal, or pork. If you eat fish or seafood, choose options that are higher in omega-3 fatty acids and lower in mercury, such as salmon, herring, mussels, trout, sardines, pollock,  shrimp, crab, and lobster. Tofu. Tempeh. Beans. Eggs. Peanut butter and other nut butters. Make sure that all meats, poultry, and eggs are cooked to food-safe temperatures or "well-done." Two or more servings of fish are recommended each week in order to get the most benefits from omega-3 fatty acids that are found in seafood. Choose fish that are lower in mercury. You can find more information online:  www.fda.gov Dairy Pasteurized milk and milk alternatives (such as almond milk). Pasteurized yogurt and pasteurized cheese. Cottage cheese. Sour cream. Beverages Water. Juices that contain 100% fruit juice or vegetable juice. Caffeine-free teas and decaffeinated coffee. Drinks that contain caffeine are okay to drink, but it is better to avoid caffeine. Keep your total caffeine intake to less than 200 mg each day (which is 12 oz   or 355 mL of coffee, tea, or soda) or the limit as told by your health care provider. Fats and oils Fats and oils are okay to include in moderation. Sweets and desserts Sweets and desserts are okay to include in moderation. Seasoning and other foods All pasteurized condiments. The items listed above may not be a complete list of foods and beverages you can eat. Contact a dietitian for more information. What foods are not recommended? Fruits Unpasteurized fruit juices. Vegetables Raw (unpasteurized) vegetable juices. Meats and other protein foods Lunch meats, bologna, hot dogs, or other deli meats. (If you must eat those meats, reheat them until they are steaming hot.) Refrigerated pat, meat spreads from a meat counter, smoked seafood that is found in the refrigerated section of a store. Raw or undercooked meats, poultry, and eggs. Raw fish, such as sushi or sashimi. Fish that have high mercury content, such as tilefish, shark, swordfish, and king mackerel. To learn more about mercury in fish, talk with your health care provider or look for online resources, such  as:  www.fda.gov Dairy Raw (unpasteurized) milk and any foods that have raw milk in them. Soft cheeses, such as feta, queso blanco, queso fresco, Brie, Camembert cheeses, blue-veined cheeses, and Panela cheese (unless it is made with pasteurized milk, which must be stated on the label). Beverages Alcohol. Sugar-sweetened beverages, such as sodas, teas, or energy drinks. Seasoning and other foods Homemade fermented foods and drinks, such as pickles, sauerkraut, or kombucha drinks. (Store-bought pasteurized versions of these are okay.) Salads that are made in a store or deli, such as ham salad, chicken salad, egg salad, tuna salad, and seafood salad. The items listed above may not be a complete list of foods and beverages you should avoid. Contact a dietitian for more information. Where to find more information To calculate the number of calories you need based on your height, weight, and activity level, you can use an online calculator such as:  www.choosemyplate.gov/MyPlatePlan To calculate how much weight you should gain during pregnancy, you can use an online pregnancy weight gain calculator such as:  www.choosemyplate.gov/pregnancy-weight-gain-calculator Summary  While you are pregnant, your body requires additional nutrition to help support your growing baby.  Eat a variety of foods, especially fruits and vegetables to get a full range of vitamins and minerals.  Practice good food safety and cleanliness. Wash your hands before you eat and after you prepare raw meat. Wash all fruits and vegetables well before peeling or eating. Taking these actions can help to prevent food-borne illnesses, such as listeriosis, that can be very dangerous to your baby.  Do not eat raw meat or fish. Do not eat fish that have high mercury content, such as tilefish, shark, swordfish, and king mackerel. Do not eat unpasteurized (raw) dairy.  Take a prenatal vitamin to help meet your additional vitamin and  mineral needs during pregnancy, specifically for folic acid, iron, calcium, and vitamin D. This information is not intended to replace advice given to you by your health care provider. Make sure you discuss any questions you have with your health care provider. Document Revised: 11/19/2018 Document Reviewed: 03/28/2017 Elsevier Patient Education  2020 [employer] Prenatal Care Prenatal care is health care during pregnancy. It helps you and your unborn baby (fetus) stay as healthy as possible. Prenatal care may be provided by a midwife, a family practice health care provider, or a childbirth and pregnancy specialist (obstetrician). How does this affect me? During pregnancy, you will be closely monitored   for any new conditions that might develop. To lower your risk of pregnancy complications, you and your health care provider will talk about any underlying conditions you have. How does this affect my baby? Early and consistent prenatal care increases the chance that your baby will be healthy during pregnancy. Prenatal care lowers the risk that your baby will be:  Born early (prematurely).  Smaller than expected at birth (small for gestational age). What can I expect at the first prenatal care visit? Your first prenatal care visit will likely be the longest. You should schedule your first prenatal care visit as soon as you know that you are pregnant. Your first visit is a good time to talk about any questions or concerns you have about pregnancy. At your visit, you and your health care provider will talk about:  Your medical history, including: ? Any past pregnancies. ? Your family's medical history. ? The baby's father's medical history. ? Any long-term (chronic) health conditions you have and how you manage them. ? Any surgeries or procedures you have had. ? Any current over-the-counter or prescription medicines, herbs, or supplements you are taking.  Other factors that could pose a risk  to your baby, including:  Your home setting and your stress levels, including: ? Exposure to abuse or violence. ? Household financial strain. ? Mental health conditions you have.  Your daily health habits, including diet and exercise. Your health care provider will also:  Measure your weight, height, and blood pressure.  Do a physical exam, including a pelvic and breast exam.  Perform blood tests and urine tests to check for: ? Urinary tract infection. ? Sexually transmitted infections (STIs). ? Low iron levels in your blood (anemia). ? Blood type and certain proteins on red blood cells (Rh antibodies). ? Infections and immunity to viruses, such as hepatitis B and rubella. ? HIV (human immunodeficiency virus).  Do an ultrasound to confirm your baby's growth and development and to help predict your estimated due date (EDD). This ultrasound is done with a probe that is inserted into the vagina (transvaginal ultrasound).  Discuss your options for genetic screening.  Give you information about how to keep yourself and your baby healthy, including: ? Nutrition and taking vitamins. ? Physical activity. ? How to manage pregnancy symptoms such as nausea and vomiting (morning sickness). ? Infections and substances that may be harmful to your baby and how to avoid them. ? Food safety. ? Dental care. ? Working. ? Travel. ? Warning signs to watch for and when to call your health care provider. How often will I have prenatal care visits? After your first prenatal care visit, you will have regular visits throughout your pregnancy. The visit schedule is often as follows:  Up to week 28 of pregnancy: once every 4 weeks.  28-36 weeks: once every 2 weeks.  After 36 weeks: every week until delivery. Some women may have visits more or less often depending on any underlying health conditions and the health of the baby. Keep all follow-up and prenatal care visits as told by your health care  provider. This is important. What happens during routine prenatal care visits? Your health care provider will:  Measure your weight and blood pressure.  Check for fetal heart sounds.  Measure the height of your uterus in your abdomen (fundal height). This may be measured starting around week 20 of pregnancy.  Check the position of your baby inside your uterus.  Ask questions about your diet, sleeping patterns, and   whether you can feel the baby move.  Review warning signs to watch for and signs of labor.  Ask about any pregnancy symptoms you are having and how you are dealing with them. Symptoms may include: ? Headaches. ? Nausea and vomiting. ? Vaginal discharge. ? Swelling. ? Fatigue. ? Constipation. ? Any discomfort, including back or pelvic pain. Make a list of questions to ask your health care provider at your routine visits. What tests might I have during prenatal care visits? You may have blood, urine, and imaging tests throughout your pregnancy, such as:  Urine tests to check for glucose, protein, or signs of infection.  Glucose tests to check for a form of diabetes that can develop during pregnancy (gestational diabetes mellitus). This is usually done around week 24 of pregnancy.  An ultrasound to check your baby's growth and development and to check for birth defects. This is usually done around week 20 of pregnancy.  A test to check for group B strep (GBS) infection. This is usually done around week 36 of pregnancy.  Genetic testing. This may include blood or imaging tests, such as an ultrasound. Some genetic tests are done during the first trimester and some are done during the second trimester. What else can I expect during prenatal care visits? Your health care provider may recommend getting certain vaccines during pregnancy. These may include:  A yearly flu shot (annual influenza vaccine). This is especially important if you will be pregnant during flu  season.  Tdap (tetanus, diphtheria, pertussis) vaccine. Getting this vaccine during pregnancy can protect your baby from whooping cough (pertussis) after birth. This vaccine may be recommended between weeks 27 and 36 of pregnancy. Later in your pregnancy, your health care provider may give you information about:  Childbirth and breastfeeding classes.  Choosing a health care provider for your baby.  Umbilical cord banking.  Breastfeeding.  Birth control after your baby is born.  The hospital labor and delivery unit and how to tour it.  Registering at the hospital before you go into labor. Where to find more information  Office on Women's Health: womenshealth.gov  American Pregnancy Association: americanpregnancy.org  March of Dimes: marchofdimes.org Summary  Prenatal care helps you and your baby stay as healthy as possible during pregnancy.  Your first prenatal care visit will most likely be the longest.  You will have visits and tests throughout your pregnancy to monitor your health and your baby's health.  Bring a list of questions to your visits to ask your health care provider.  Make sure to keep all follow-up and prenatal care visits with your health care provider. This information is not intended to replace advice given to you by your health care provider. Make sure you discuss any questions you have with your health care provider. Document Revised: 10/21/2018 Document Reviewed: 06/30/2017 Elsevier Patient Education  2020 [employer]  

## 2020-03-29 NOTE — Progress Notes (Signed)
NOB 

## 2020-03-29 NOTE — Progress Notes (Signed)
New Obstetric Patient H&P    Chief Complaint: "Desires prenatal care"   History of Present Illness: Patient is a 19 y.o. G1P0000 Not Hispanic or Latino female, presents with amenorrhea and positive home pregnancy test. Patient's last menstrual period was 01/05/2020. and based on [redacted]w[redacted]d [facility] [employer], her EDD is Estimated Date of Delivery: 10/21/20 and her EGA is [redacted]w[redacted]d. Cycles are 4-5 days, regular, and occur approximately every : 28 days. Her first PAP smear is today. She had a urine pregnancy test which was positive 7 week(s)  ago. Her last menstrual period was normal and lasted for  4 or 5 day(s). Since her LMP she claims she has experienced breast tenderness, fatigue, nausea, vomiting. She denies vaginal bleeding. Her past medical history is noncontributory. This is her first pregnancy.  Since her LMP, she admits to the use of tobacco products  no She claims she has gained  2 pounds since the start of her pregnancy.  There are cats in the home in the home  yes If yes indoor/outdoor- no litterbox She admits close contact with children on a regular basis  no  She has had chicken pox in the past no She has had Tuberculosis exposures, symptoms, or previously tested positive for TB   no Current or past history of domestic violence. no  Genetic Screening/Teratology Counseling: (Includes patient, baby's father, or anyone in either family with:)   1. Patient's age >/= 59 at [facility]  no 2. Thalassemia ([place], [place], Mediterranean, or Asian background): MCV<80  no 3. Neural tube defect (meningomyelocele, spina bifida, anencephaly)  no 4. Congenital heart defect  no  5. Down syndrome  no 6. Tay-Sachs (Jewish, [place]  no 7. Canavan's Disease  no 8. Sickle cell disease or trait (African)  no  9. Hemophilia or other blood disorders  no  10. Muscular dystrophy  no  11. Cystic fibrosis  no  12. Huntington's Chorea  no  13. Mental retardation/autism  no 14. Other  inherited genetic or chromosomal disorder  no 15. Maternal metabolic disorder (DM, PKU, etc)  no 16. Patient or FOB with a child with a birth defect not listed above no  16a. Patient or FOB with a birth defect themselves no 17. Recurrent pregnancy loss, or stillbirth  no  18. Any medications since LMP other than prenatal vitamins (include vitamins, supplements, OTC meds, drugs, alcohol)  no 19. Any other genetic/environmental exposure to discuss  no  Infection History:   1. Lives with someone with TB or TB exposed  no  2. Patient or partner has history of genital herpes  no 3. Rash or viral illness since LMP  no 4. History of STI (GC, CT, HPV, syphilis, HIV)  no 5. History of recent travel :  no  Other pertinent information:  no     Review of Systems:10 point review of systems negative unless otherwise noted in HPI  Past Medical History:  Patient Active Problem List   Diagnosis Date Noted  . Encounter for supervision of normal first pregnancy in first trimester 03/29/2020    Clinic [facility] Prenatal Labs  Dating By [redacted]w[redacted]d u/s done @ [facility] [place] Blood type:     Genetic Screen 1 Screen:    AFP:     Quad:     NIPS: Antibody:   Anatomic [place]  Rubella:    Varicella: @VZVIGG @  GTT Early: NA             Third trimester:  RPR:     Rhogam  HBsAg:     Vaccines TDAP:                       Flu Shot: Covid: HIV:     Baby Food Breast- education done at [facility]                               GBS:   GC/CT:  Contraception  Pap: first PAP 03/29/2020  CBB     CS/VBAC NA   Support Person Husband [name]         Past Surgical History:  Past Surgical History:  Procedure Laterality Date  . NO PAST SURGERIES      Gynecologic History: Patient's last menstrual period was 01/05/2020.  Obstetric History: G1P0000  Family History:  Family History  Problem Relation Age of Onset  . Diabetes Father        TYPE 2    Social History:  Social History   Socioeconomic History  . Marital  status: Married    Spouse name: Not on file  . Number of children: 0  . Years of education: 8811  . Highest education level: Not on file  Occupational History  . Not on file  Tobacco Use  . Smoking status: Never Smoker  . Smokeless tobacco: Never Used  Vaping Use  . Vaping Use: Never used  Substance and Sexual Activity  . Alcohol use: No  . Drug use: No  . Sexual activity: Yes    Birth control/protection: None  Other Topics Concern  . Not on file  Social History Narrative  . Not on file   Social Determinants of Health   Financial Resource Strain:   . Difficulty of Paying Living Expenses: Not on file  Food Insecurity:   . Worried About Programme researcher, broadcasting/film/videounning Out of Food in the Last Year: Not on file  . Ran Out of Food in the Last Year: Not on file  Transportation Needs:   . Lack of Transportation (Medical): Not on file  . Lack of Transportation (Non-Medical): Not on file  Physical Activity:   . Days of Exercise per Week: Not on file  . Minutes of Exercise per Session: Not on file  Stress:   . Feeling of Stress : Not on file  Social Connections:   . Frequency of Communication with Friends and Family: Not on file  . Frequency of Social Gatherings with Friends and Family: Not on file  . Attends Religious Services: Not on file  . Active Member of Clubs or Organizations: Not on file  . Attends BankerClub or Organization Meetings: Not on file  . Marital Status: Not on file  Intimate Partner Violence:   . Fear of Current or Ex-Partner: Not on file  . Emotionally Abused: Not on file  . Physically Abused: Not on file  . Sexually Abused: Not on file    Allergies:  No Known Allergies  Medications: Prior to Admission medications   Not on File    Physical Exam Vitals: Blood pressure 120/77, pulse 88, weight 162 lb (73.5 kg), last menstrual period 01/05/2020.  General: NAD HEENT: normocephalic, anicteric Thyroid: no enlargement, no palpable nodules Pulmonary: No increased work of  breathing, CTAB Cardiovascular: RRR, distal pulses 2+ Abdomen: NABS, soft, non-tender, non-distended.  Umbilicus without lesions.  No hepatomegaly, splenomegaly or masses palpable. No evidence of hernia  Genitourinary:  External: Normal external female genitalia.  Normal urethral meatus, normal Bartholin's and Skene's glands.    Vagina: Normal vaginal mucosa, no evidence of prolapse.    Cervix: Grossly normal in appearance, no bleeding, no CMT  Uterus:  Non-enlarged, mobile, normal contour.    Adnexa: ovaries non-enlarged, no adnexal masses  Rectal: deferred Extremities: no edema, erythema, or tenderness Neurologic: Grossly intact Psychiatric: mood appropriate, affect full  BS ultrasound: CRL [redacted]w[redacted]d, FHR 165  The following were addressed during this visit:  Breastfeeding Education - Early initiation of breastfeeding    Comments: Keeps milk supply adequate, helps contract uterus and slow bleeding, and early milk is the perfect first food and is easy to digest.   - The importance of exclusive breastfeeding    Comments: Provides antibodies, Lower risk of breast and ovarian cancers, and type-2 diabetes,Helps your body recover, Reduced chance of SIDS.   - Risks of giving your baby anything other than breast milk if you are breastfeeding    Comments: Make the baby less content with breastfeeds, may make my baby more susceptible to illness, and may reduce my milk supply.   - The importance of early skin-to-skin contact    Comments: Keeps baby warm and secure, helps keep baby's blood sugar up and breathing steady, easier to bond and breastfeed, and helps calm baby.  - Rooming-in on a 24-hour basis    Comments: Easier to learn baby's feeding cues, easier to bond and get to know each other, and encourages milk production.   - Feeding on demand or baby-led feeding    Comments: Helps prevent breastfeeding complications, helps bring in good milk supply, prevents under or overfeeding, and  helps baby feel content and satisfied   - Frequent feeding to help assure optimal milk production    Comments: Making a full supply of milk requires frequent removal of milk from breasts, infant will eat 8-12 times in 24 hours, if separated from infant use breast massage, hand expression and/ or pumping to remove milk from breasts.   - Effective positioning and attachment    Comments: Helps my baby to get enough breast milk, helps to produce an adequate milk supply, and helps prevent nipple pain and damage   - Exclusive breastfeeding for the first 6 months    Comments: Builds a healthy milk supply and keeps it up, protects baby from sickness and disease, and breastmilk has everything your baby needs for the first 6 months.     Assessment: 19 y.o. G1P0000 at [redacted]w[redacted]d by early ultrasound presenting to initiate prenatal care  Plan: 1) Avoid alcoholic beverages. 2) Patient encouraged not to smoke.  3) Discontinue the use of all non-medicinal drugs and chemicals.  4) Take prenatal vitamins daily.  5) Nutrition, food safety (fish, cheese advisories, and high nitrite foods) and exercise discussed. 6) Hospital and practice style discussed with cross coverage system.  7) Genetic Screening, such as with 1st Trimester Screening, cell free fetal DNA, AFP testing, and Ultrasound, as well as with amniocentesis and CVS as appropriate, is discussed with patient. At the conclusion of today's visit patient requested cell free DNA genetic testing 8) Patient is asked about travel to areas at risk for the Zika virus, and counseled to avoid travel and exposure to mosquitoes or sexual partners who may have themselves been exposed to the virus. Testing is discussed, and will be ordered as appropriate. 9) Paptima, urine culture, [facility] panel, MaterniT 21 today 10) Return to clinic in 4 weeks for ROB   Tresea Mall, CNM [facility]  [facility] 03/29/2020, 10:45 AM

## 2020-03-31 LAB — URINE CULTURE

## 2020-03-31 LAB — CYTOLOGY - PAP
Chlamydia: NEGATIVE
Comment: NEGATIVE
Comment: NEGATIVE
Comment: NORMAL
Diagnosis: NEGATIVE
Neisseria Gonorrhea: NEGATIVE
Trichomonas: NEGATIVE

## 2020-04-02 LAB — MATERNIT 21 PLUS CORE, BLOOD
Fetal Fraction: 8
Result (T21): NEGATIVE
Trisomy 13 (Patau syndrome): NEGATIVE
Trisomy 18 (Edwards syndrome): NEGATIVE
Trisomy 21 (Down syndrome): NEGATIVE

## 2020-04-02 LAB — RPR+RH+ABO+RUB AB+AB SCR+CB...
Antibody Screen: NEGATIVE
HIV Screen 4th Generation wRfx: NONREACTIVE
Hematocrit: 37.6 % (ref 34.0–46.6)
Hemoglobin: 13.3 g/dL (ref 11.1–15.9)
Hepatitis B Surface Ag: NEGATIVE
MCH: 30.9 pg (ref 26.6–33.0)
MCHC: 35.4 g/dL (ref 31.5–35.7)
MCV: 87 fL (ref 79–97)
Platelets: 236 10*3/uL (ref 150–450)
RBC: 4.31 x10E6/uL (ref 3.77–5.28)
RDW: 12.2 % (ref 11.7–15.4)
RPR Ser Ql: NONREACTIVE
Rh Factor: POSITIVE
Rubella Antibodies, IGG: 1.93 index (ref >0.99)
Varicella zoster IgG: <135 index — ABNORMAL LOW (ref >165)
WBC: 6.7 10*3/uL (ref 3.4–10.8)

## 2020-04-24 ENCOUNTER — Encounter: Payer: 59 | Admitting: Obstetrics and Gynecology

## 2020-04-27 ENCOUNTER — Encounter: Payer: Self-pay | Admitting: Advanced Practice Midwife

## 2020-04-27 ENCOUNTER — Ambulatory Visit (INDEPENDENT_AMBULATORY_CARE_PROVIDER_SITE_OTHER): Payer: 59 | Admitting: Advanced Practice Midwife

## 2020-04-27 ENCOUNTER — Other Ambulatory Visit: Payer: Self-pay

## 2020-04-27 VITALS — BP 114/68 | Wt 163.0 lb

## 2020-04-27 DIAGNOSIS — Z3402 Encounter for supervision of normal first pregnancy, second trimester: Secondary | ICD-10-CM

## 2020-04-27 DIAGNOSIS — Z3A14 14 weeks gestation of pregnancy: Secondary | ICD-10-CM

## 2020-04-27 LAB — POCT URINALYSIS DIPSTICK OB
Glucose, UA: NEGATIVE
POC,PROTEIN,UA: NEGATIVE

## 2020-04-27 NOTE — Patient Instructions (Signed)

## 2020-04-27 NOTE — Progress Notes (Signed)
No vb, no lof. Slight tight/sharp feeling on pelvic area on both sides

## 2020-04-27 NOTE — Progress Notes (Signed)
  Routine Prenatal Care Visit  Subjective  Marissa Kennedy is a 19 y.o. G1P0000 at [redacted]w[redacted]d being seen today for ongoing prenatal care.  She is currently monitored for the following issues for this low-risk pregnancy and has Encounter for supervision of normal first pregnancy in first trimester on their problem list.  ----------------------------------------------------------------------------------- Patient reports some mild round ligament pain.    . Vag. Bleeding: None.  Movement: Present. Leaking Fluid denies.  ----------------------------------------------------------------------------------- The following portions of the patient's history were reviewed and updated as appropriate: allergies, current medications, past family history, past medical history, past social history, past surgical history and problem list. Problem list updated.  Objective  Blood pressure 114/68, weight 163 lb (73.9 kg), last menstrual period 01/05/2020. Pregravid weight 160 lb (72.6 kg) Total Weight Gain 3 lb (1.361 kg) Urinalysis: Urine Protein    Urine Glucose    Fetal Status: Fetal Heart Rate (bpm): 142   Movement: Present      BS u/s good fetal movement and heart rate (patient requested u/s)  General:  Alert, oriented and cooperative. Patient is in no acute distress.  Skin: Skin is warm and dry. No rash noted.   Cardiovascular: Normal heart rate noted  Respiratory: Normal respiratory effort, no problems with respiration noted  Abdomen: Soft, gravid, appropriate for gestational age.       Pelvic:  Cervical exam deferred        Extremities: Normal range of motion.     Mental Status: Normal mood and affect. Normal behavior. Normal judgment and thought content.   Assessment   19 y.o. G1P0000 at [redacted]w[redacted]d by  10/21/2020, by Ultrasound presenting for routine prenatal visit  Plan   Pregnancy#1 Problems (from 01/05/20 to present)    Problem Noted Resolved   Encounter for supervision of normal first pregnancy in  first trimester 03/29/2020 by Tresea Mall, CNM No   Overview Addendum 03/29/2020 10:53 AM by Tresea Mall, CNM    Clinic [facility] Prenatal Labs  Dating By [redacted]w[redacted]d u/s done @ [facility] [place] Blood type:   A+  Genetic Screen 1 Screen:    AFP:     Quad:     NIPS: Antibody: negative  Anatomic [place]  Rubella: Immune   Varicella: Non-immune  GTT Early: NA               Third trimester:  RPR:     Rhogam  HBsAg:     Vaccines TDAP:                       Flu Shot: Covid: HIV:     Baby Food Breast- education done at [facility]                               GBS:   GC/CT:  Contraception  Pap: first PAP 03/29/2020  CBB     CS/VBAC NA   Support Person Husband [name]          Previous Version       Preterm labor symptoms and general obstetric precautions including but not limited to vaginal bleeding, contractions, leaking of fluid and fetal movement were reviewed in detail with the patient. Please refer to After Visit Summary for other counseling recommendations.   Return in about 4 weeks (around 05/25/2020) for anatomy scan and rob.  Tresea Mall, CNM 04/27/2020 4:44 PM

## 2020-05-11 ENCOUNTER — Telehealth: Payer: Self-pay

## 2020-05-11 NOTE — Telephone Encounter (Signed)
I don't usually have anything to do with breast pump ordering- CMAs usually take care of it. So, no, I don't remember signing anything.

## 2020-05-11 NOTE — Telephone Encounter (Signed)
I have not recently faxed any breast pump forms, so I am not very sure. I dont think I have gotten one on this patient

## 2020-05-11 NOTE — Telephone Encounter (Signed)
Revonda Standard c [employer] calling to ck the status of rx for breast pump that was faxed.  [phone]

## 2020-05-15 NOTE — Telephone Encounter (Signed)
I called to have rx for breast pump refaxed but to my attention.

## 2020-05-25 ENCOUNTER — Encounter: Payer: 59 | Admitting: Obstetrics and Gynecology

## 2020-05-25 ENCOUNTER — Ambulatory Visit: Payer: 59

## 2020-05-25 DIAGNOSIS — Z3402 Encounter for supervision of normal first pregnancy, second trimester: Secondary | ICD-10-CM

## 2020-05-25 NOTE — Telephone Encounter (Signed)
Revonda Standard calling to verify if fax prescription request had been received. [id number]#[phone]

## 2020-05-25 NOTE — Telephone Encounter (Signed)
I have not received one for this patient. I just did several this morning and didn't see that one

## 2020-06-01 NOTE — Telephone Encounter (Signed)
I called the company and explained the situation that they have the correct fax number for [place] but we haven't recv'd the order form.  Fax number verified and fax number in my office given.  Requested it be sent to my attention.

## 2020-06-01 NOTE — Telephone Encounter (Signed)
This encounter was created in error - please disregard.

## 2020-06-02 NOTE — Telephone Encounter (Signed)
Late entry: form recv'd 06/01/20, signed by [facility] 06/01/20, and faxed 06/02/20 c confirmation recv'd.

## 2020-06-05 ENCOUNTER — Ambulatory Visit (INDEPENDENT_AMBULATORY_CARE_PROVIDER_SITE_OTHER): Payer: 59

## 2020-06-05 ENCOUNTER — Ambulatory Visit (INDEPENDENT_AMBULATORY_CARE_PROVIDER_SITE_OTHER): Payer: 59 | Admitting: Obstetrics and Gynecology

## 2020-06-05 ENCOUNTER — Other Ambulatory Visit: Payer: Self-pay

## 2020-06-05 VITALS — BP 110/62 | Wt 170.0 lb

## 2020-06-05 DIAGNOSIS — Z3402 Encounter for supervision of normal first pregnancy, second trimester: Secondary | ICD-10-CM

## 2020-06-05 DIAGNOSIS — Z3A19 19 weeks gestation of pregnancy: Secondary | ICD-10-CM

## 2020-06-05 LAB — POCT URINALYSIS DIPSTICK OB
Glucose, UA: NEGATIVE
POC,PROTEIN,UA: NEGATIVE

## 2020-06-05 NOTE — Progress Notes (Signed)
Routine Prenatal Care Visit  Subjective  Marissa Kennedy is a 19 y.o. G1P0000 at [redacted]w[redacted]d being seen today for ongoing prenatal care.  She is currently monitored for the following issues for this low-risk pregnancy and has Encounter for supervision of normal first pregnancy in first trimester on their problem list.  ----------------------------------------------------------------------------------- Patient reports no complaints.   Contractions: Not present. Vag. Bleeding: None.  Movement: Present. Denies leaking of fluid.  ----------------------------------------------------------------------------------- The following portions of the patient's history were reviewed and updated as appropriate: allergies, current medications, past family history, past medical history, past social history, past surgical history and problem list. Problem list updated.   Objective  Last menstrual period 01/05/2020. Blood pressure 110/62, weight 170 lb (77.1 kg), last menstrual period 01/05/2020.  Pregravid weight 160 lb (72.6 kg) Total Weight Gain 10 lb (4.536 kg) Urinalysis:      Fetal Status: Fetal Heart Rate (bpm): 145   Movement: Present     General:  Alert, oriented and cooperative. Patient is in no acute distress.  Skin: Skin is warm and dry. No rash noted.   Cardiovascular: Normal heart rate noted  Respiratory: Normal respiratory effort, no problems with respiration noted  Abdomen: Soft, gravid, appropriate for gestational age. Pain/Pressure: Absent     Pelvic:  Cervical exam deferred        Extremities: Normal range of motion.     ental Status: Normal mood and affect. Normal behavior. Normal judgment and thought content.   US OB Comp + 14 Wk  Result Date: 06/05/2020 Patient Name: Marissa Kennedy DOB: 07-02-2001 MRN: [id number] ULTRASOUND REPORT Location: [facility] Date of Service: 06/05/2020 Indications:Anatomy Ultrasound Findings: Mason Jim intrauterine pregnancy is visualized with FHR  at 145 BPM. Biometrics give an (U/S) Gestational age of [redacted]w[redacted]d and an (U/S) EDD of 10/13/2020; this correlates with the clinically established Estimated Date of Delivery: 10/21/20 Fetal presentation is Cephalic. EFW: 425 g ( 15 oz ). Placenta: posterior. Grade: 1 AFI: subjectively normal. Anatomic survey is complete and normal; Gender - female.  Impression: 1. [redacted]w[redacted]d Viable Singleton Intrauterine pregnancy by U/S. 2. (U/S) EDD is consistent with Clinically established Estimated Date of Delivery: 10/21/20 . 3. Normal Anatomy Scan Recommendations: 1.Clinical correlation with the patient's History and Physical Exam. Marissa Kennedy, RT  There is a singleton gestation with subjectively normal amniotic fluid volume. The fetal biometry correlates with established dating. Detailed evaluation of the fetal anatomy was performed.The fetal anatomical survey appears within normal limits within the resolution of ultrasound as described above.  It must be noted that a normal ultrasound is unable to rule out fetal aneuploidy, subtle defects such as small ASD or VDS may also not be visible on imaging.  Marissa [place], MD, Ever[facility], [facility] 06/05/2020, 3:49 PM     Assessment   19 y.o. G1P0000 at [redacted]w[redacted]d by  10/21/2020, by Ultrasound presenting for routine prenatal visit  Plan   Pregnancy#1 Problems (from 01/05/20 to present)    Problem Noted Resolved   Encounter for supervision of normal first pregnancy in first trimester 03/29/2020 by Tresea Mall, CNM No   Overview Addendum 04/27/2020  4:50 PM by Tresea Mall, CNM    Clinic [facility] Prenatal Labs  Dating By [redacted]w[redacted]d u/s done @ [facility] [place] Blood type: A+  Genetic Screen 1 Screen:    AFP:     Quad:     NIPS: Antibody: negative  Anatomic [place]  Rubella: Immune Varicella: Non-immune  GTT Early: NA  Third trimester:  RPR:     Rhogam  HBsAg:     Vaccines TDAP:                       Flu Shot: Covid: HIV:     Baby  Food Breast- education done at [facility]                               GBS:   GC/CT:  Contraception  Pap: first PAP 03/29/2020  CBB     CS/VBAC NA   Support Person Husband Marissa Kennedy          Previous Version       Gestational age appropriate obstetric precautions including but not limited to vaginal bleeding, contractions, leaking of fluid and fetal movement were reviewed in detail with the patient.    Return in about 4 weeks (around 07/03/2020) for ROB.  Marissa [place], MD, Ever[facility] Core [facility], [facility] 06/05/2020, 4:21 PM

## 2020-06-05 NOTE — Progress Notes (Signed)
ROB - ANATOMY SCAN, Blood sugar drops and she feels faint often. RM 4

## 2020-07-03 ENCOUNTER — Other Ambulatory Visit: Payer: Self-pay

## 2020-07-03 ENCOUNTER — Encounter: Payer: Self-pay | Admitting: Obstetrics and Gynecology

## 2020-07-03 ENCOUNTER — Ambulatory Visit (INDEPENDENT_AMBULATORY_CARE_PROVIDER_SITE_OTHER): Payer: 59 | Admitting: Obstetrics and Gynecology

## 2020-07-03 VITALS — BP 112/70 | Ht 63.0 in | Wt 177.8 lb

## 2020-07-03 DIAGNOSIS — Z3A24 24 weeks gestation of pregnancy: Secondary | ICD-10-CM

## 2020-07-03 DIAGNOSIS — Z3401 Encounter for supervision of normal first pregnancy, first trimester: Secondary | ICD-10-CM

## 2020-07-03 NOTE — Progress Notes (Signed)
    Routine Prenatal Care Visit  Subjective  Marissa Kennedy is a 19 y.o. G1P0000 at [redacted]w[redacted]d being seen today for ongoing prenatal care.  She is currently monitored for the following issues for this low-risk pregnancy and has Encounter for supervision of normal first pregnancy in first trimester on their problem list.  ----------------------------------------------------------------------------------- Patient reports no complaints.   Contractions: Not present. Vag. Bleeding: None.  Movement: Present. Denies leaking of fluid.  ----------------------------------------------------------------------------------- The following portions of the patient's history were reviewed and updated as appropriate: allergies, current medications, past family history, past medical history, past social history, past surgical history and problem list. Problem list updated.   Objective  Blood pressure 112/70, height 5\' 3"  (1.6 m), weight 177 lb 12.8 oz (80.6 kg), last menstrual period 01/05/2020. Pregravid weight 160 lb (72.6 kg) Total Weight Gain 17 lb 12.8 oz (8.074 kg) Urinalysis:      Fetal Status: Fetal Heart Rate (bpm): 154   Movement: Present     General:  Alert, oriented and cooperative. Patient is in no acute distress.  Skin: Skin is warm and dry. No rash noted.   Cardiovascular: Normal heart rate noted  Respiratory: Normal respiratory effort, no problems with respiration noted  Abdomen: Soft, gravid, appropriate for gestational age. Pain/Pressure: Absent     Pelvic:  Cervical exam deferred        Extremities: Normal range of motion.     Mental Status: Normal mood and affect. Normal behavior. Normal judgment and thought content.     Assessment   19 y.o. G1P0000 at [redacted]w[redacted]d by  10/21/2020, by Ultrasound presenting for routine prenatal visit  Plan   Pregnancy#1 Problems (from 01/05/20 to present)    Problem Noted Resolved   Encounter for supervision of normal first pregnancy in first trimester  03/29/2020 by 03/31/2020, CNM No   Overview Addendum 07/03/2020 10:36 AM by 07/05/2020, MD    Clinic [facility] Prenatal Labs  Dating By [redacted]w[redacted]d u/s done @ [facility] [place] Blood type: A+  Genetic Screen NIPS: normal XX Antibody: negative  Anatomic [redacted]w[redacted]d Normal Rubella: Immune Varicella: Non-immune  GTT Early: NA               Third trimester:  RPR: Non Reactive (09/15 1105)   Rhogam Not needed HBsAg: Negative (09/15 1105)   Vaccines TDAP:                       Flu Shot: Declines Covid: unvaccinated HIV: Non Reactive (09/15 1105)   Baby Food Breast- education done at [facility]                               GBS:   GC/CT:  Contraception  pill Pap: first PAP 03/29/2020  CBB   Anatomy Scan: complete  CS/VBAC NA   Support Person Husband [name]          Previous Version       Discussed prenatal classes Encouraged covid vaccination Patient declines flu vaccination 1GTT next visit  Gestational age appropriate obstetric precautions including but not limited to vaginal bleeding, contractions, leaking of fluid and fetal movement were reviewed in detail with the patient.    Return in about 4 years (around 07/03/2024) for ROB and 1 GTT.  07/05/2024 MD [facility], [facility] 07/03/2020, 10:42 AM

## 2020-07-03 NOTE — Patient Instructions (Signed)

## 2020-07-03 NOTE — Progress Notes (Signed)
ROB [redacted]w[redacted]d

## 2020-07-15 NOTE — L&D Delivery Note (Signed)
Delivery Note Primary OB: [facility] Delivery Physician: Annamarie Major, MD Gestational Age: Full term Antepartum complications: none Intrapartum complications: Maternal Exhaustion  A viable Female was delivered via vertex presentation. "Maeva"  PREOPERATIVE DIAGNOSES: 1. Term pregnancy at [redacted]wks Gestational Age 22. Maternal Exhaustion    POSTOPERATIVE DIAGNOSES: 1. Term pregnancy at [redacted]wks Gestational Age 22. Live, Viable female infant 3. Maternal Exhaustion    OPERATION PERFORMED: Vacuum-Assisted Vaginal Delivery   SURGEON: Annamarie Major, MD   ANESTHESIA: Epidural   ESTIMATED BLOOD LOSS: 200 cc   FINDINGS: Delivered a female infant  Apgars 9 and 9, three-vessel cord and normal placenta.   COMPLICATIONS: None   SITUATION:  The options for labor management at his time were discussed with the patient and her partner, as were the delivery options including a Kiwi vacuum delivery. The pros and cons and the risks of the vacuum delivery were discussed in detail, as were the alternative approaches. The patient and her partner decided to proceed with a Kiwi vacuum delivery.    DESCRIPTION OF THE PROCEDURE:    The baby's head was confirmed to be in the OA presentation with 100% effacement and +1 station. The bladder was drained. The vacuum was placed and the correct placement in front of the posterior fontanelle was confirmed digitally. With the patient's next contraction, the vacuum was inflated and a gentle pressure was used to assist with maternal pushes to deliver the baby's head. In total there were 5pulls and 1 pop-offs. The vacuum was released between contractions. With crowning of the head the perineum was injected with 1% lidocaine and a episiotomy was made with the scissors.  After delivery of the head, the vacuum was deflated and removed. There was no nuchal cord.  The anterior  shoulder was delivered with gentle downward guidance followed by delivery of the posterior  shoulder with gentle  upward guidance. The infant was placed on the maternal chest.  The cord was clamped x2 and cut. The infant was handed to the NICU attendants.   Pitocin was added to the patient's IV fluids. The placenta delivered spontaneously, was intact and had a three-vessel cord. A vaginal inspection revealed 3rd perineal lacetation. The laceration  was repaired with #2-0Vicryl suture in a normal fashion with local anesthesia.    At the end of the delivery mom and baby were recovering in stable condition.  Sponge, instrument, and needle counts were correct times two.   Annamarie Major, MD, [facility], [facility] 10/15/2020  3:24 AM

## 2020-07-31 ENCOUNTER — Encounter: Payer: 59 | Admitting: Obstetrics

## 2020-07-31 ENCOUNTER — Other Ambulatory Visit: Payer: 59

## 2020-08-04 ENCOUNTER — Other Ambulatory Visit: Payer: Self-pay

## 2020-08-04 ENCOUNTER — Ambulatory Visit (INDEPENDENT_AMBULATORY_CARE_PROVIDER_SITE_OTHER): Payer: No Typology Code available for payment source | Admitting: Obstetrics

## 2020-08-04 VITALS — BP 120/70 | Wt 188.0 lb

## 2020-08-04 DIAGNOSIS — Z3A28 28 weeks gestation of pregnancy: Secondary | ICD-10-CM

## 2020-08-04 DIAGNOSIS — Z3401 Encounter for supervision of normal first pregnancy, first trimester: Secondary | ICD-10-CM

## 2020-08-04 DIAGNOSIS — Z3402 Encounter for supervision of normal first pregnancy, second trimester: Secondary | ICD-10-CM

## 2020-08-04 LAB — POCT URINALYSIS DIPSTICK OB
Glucose, UA: NEGATIVE
POC,PROTEIN,UA: NEGATIVE

## 2020-08-04 NOTE — Progress Notes (Signed)
  Routine Prenatal Care Visit  Subjective  Marissa Kennedy is a 20 y.o. G1P0000 at [redacted]w[redacted]d being seen today for ongoing prenatal care.  She is currently monitored for the following issues for this low-risk pregnancy and has Encounter for supervision of normal first pregnancy in first trimester on their problem list.  ----------------------------------------------------------------------------------- Patient reports no complaints.    .  .   [name] denies.  ----------------------------------------------------------------------------------- The following portions of the patient's history were reviewed and updated as appropriate: allergies, current medications, past family history, past medical history, past social history, past surgical history and problem list. Problem list updated.  Objective  Blood pressure 120/70, weight 188 lb (85.3 kg), last menstrual period 01/05/2020. Pregravid weight 160 lb (72.6 kg) Total Weight Gain 28 lb (12.7 kg) Urinalysis: Urine Protein    Urine Glucose    Fetal Status:           General:  Alert, oriented and cooperative. Patient is in no acute distress.  Skin: Skin is warm and dry. No rash noted.   Cardiovascular: Normal heart rate noted  Respiratory: Normal respiratory effort, no problems with respiration noted  Abdomen: Soft, gravid, appropriate for gestational age.       Pelvic:  Cervical exam deferred        Extremities: Normal range of motion.     Mental Status: Normal mood and affect. Normal behavior. Normal judgment and thought content.   Assessment   20 y.o. G1P0000 at [redacted]w[redacted]d by  10/21/2020, by Ultrasound presenting for routine prenatal visit  Plan   Pregnancy#1 Problems (from 01/05/20 to present)    Problem Noted Resolved   Encounter for supervision of normal first pregnancy in first trimester 03/29/2020 by Tresea Mall, CNM No   Overview Addendum 08/04/2020  9:25 AM by Mirna Mires, CNM    Clinic [facility] Prenatal Labs  Dating  By [redacted]w[redacted]d u/s done @ [facility] Blood type: A+  Genetic Screen NIPS: normal XX Antibody: negative  Anatomic [place] Normal Rubella: Immune Varicella: Non-immune  GTT Early: NA               Third trimester: 08/04/20 RPR: Non Reactive (09/15 1105)   Rhogam Not needed HBsAg: Negative (09/15 1105)   Vaccines TDAP:                       Flu Shot: Declines Covid: unvaccinated HIV: Non Reactive (09/15 1105)   Baby Food Breast- education done at [facility]                               GBS:   GC/CT:  Contraception  pill Pap: first PAP 03/29/2020  CBB   Anatomy Scan: complete  CS/VBAC NA   Support Person Husband [name]          Previous Version       Preterm labor symptoms and general obstetric precautions including but not limited to vaginal bleeding, contractions, leaking of fluid and fetal movement were reviewed in detail with the patient. Please refer to After Visit Summary for other counseling recommendations.  Discussed preparing for labor and birth. Encouraged to use online education, read labor books , ie Rhetta Mura and Ettrick.She is committed to breastfeeding,ka nd will use POPs for [facility].  Return in about 2 weeks (around 08/18/2020) for return OB.  Mirna Mires, CNM  08/04/2020 9:26 AM

## 2020-08-04 NOTE — Addendum Note (Signed)
Addended by: Donnetta Hail on: 08/04/2020 09:55 AM   Modules accepted: Orders

## 2020-08-04 NOTE — Progress Notes (Signed)
ROB/1 hr GTT- no concerns 

## 2020-08-05 LAB — 28 WEEK RH+PANEL
Basophils Absolute: 0 10*3/uL (ref 0.0–0.2)
Basos: 0 %
EOS (ABSOLUTE): 0 10*3/uL (ref 0.0–0.4)
Eos: 0 %
Gestational Diabetes Screen: 137 mg/dL (ref 65–139)
HIV Screen 4th Generation wRfx: NONREACTIVE
Hematocrit: 35.4 % (ref 34.0–46.6)
Hemoglobin: 12.2 g/dL (ref 11.1–15.9)
Immature Grans (Abs): 0.1 10*3/uL (ref 0.0–0.1)
Immature Granulocytes: 1 %
Lymphocytes Absolute: 1 10*3/uL (ref 0.7–3.1)
Lymphs: 14 %
MCH: 31 pg (ref 26.6–33.0)
MCHC: 34.5 g/dL (ref 31.5–35.7)
MCV: 90 fL (ref 79–97)
Monocytes Absolute: 0.5 10*3/uL (ref 0.1–0.9)
Monocytes: 8 %
Neutrophils Absolute: 5.3 10*3/uL (ref 1.4–7.0)
Neutrophils: 77 %
Platelets: 185 10*3/uL (ref 150–450)
RBC: 3.93 x10E6/uL (ref 3.77–5.28)
RDW: 12 % (ref 11.7–15.4)
RPR Ser Ql: NONREACTIVE
WBC: 7 10*3/uL (ref 3.4–10.8)

## 2020-08-18 ENCOUNTER — Other Ambulatory Visit: Payer: Self-pay

## 2020-08-18 ENCOUNTER — Ambulatory Visit (INDEPENDENT_AMBULATORY_CARE_PROVIDER_SITE_OTHER): Payer: No Typology Code available for payment source | Admitting: Obstetrics and Gynecology

## 2020-08-18 ENCOUNTER — Encounter: Payer: Self-pay | Admitting: Obstetrics and Gynecology

## 2020-08-18 VITALS — BP 110/70 | Wt 194.0 lb

## 2020-08-18 DIAGNOSIS — Z3402 Encounter for supervision of normal first pregnancy, second trimester: Secondary | ICD-10-CM

## 2020-08-18 DIAGNOSIS — Z3A30 30 weeks gestation of pregnancy: Secondary | ICD-10-CM

## 2020-08-18 LAB — POCT URINALYSIS DIPSTICK OB
Glucose, UA: NEGATIVE
POC,PROTEIN,UA: NEGATIVE

## 2020-08-31 NOTE — Progress Notes (Signed)
Routine Prenatal Care Visit  Subjective  Marissa Kennedy is a 20 y.o. G1P0000 at [redacted]w[redacted]d being seen today for ongoing prenatal care.  She is currently monitored for the following issues for this low-risk pregnancy and has Encounter for supervision of normal first pregnancy in first trimester on their problem list.  ----------------------------------------------------------------------------------- Patient reports body aches all over. Patient states that her pelvis hurts getting out of bed, that she has interrmitent back pain, and that her abdomen hurts. Patient states that the pain started about 2 weeks ago. She does report having a cold recently and tested negative for Covid-19. Patient reports that she has had to quite working at a [employer] because of the discomfort. Patient reports she has tried tylenol with minimal effect on pain. Patient reports that pain does resolve when she is sitting. She reports she is not experiencing any discomfort while sitting during our converstation today. Patient also reports significant difficulty getting quality sleep due to discomfort.   Contractions: Not present. Vag. Bleeding: None.  Movement: Present. Denies leaking of fluid.  ----------------------------------------------------------------------------------- The following portions of the patient's history were reviewed and updated as appropriate: allergies, current medications, past family history, past medical history, past social history, past surgical history and problem list. Problem list updated.   Objective  Blood pressure 110/70, weight 194 lb (88 kg), last menstrual period 01/05/2020. Pregravid weight 160 lb (72.6 kg) Total Weight Gain 34 lb (15.4 kg) Urinalysis:      Fetal Status: Fetal Heart Rate (bpm): 160 Fundal Height: 28 cm Movement: Present     General:  Alert, oriented and cooperative. Patient is in no acute distress.  Skin: Skin is warm and dry. No rash noted.   Cardiovascular:  Normal heart rate noted  Respiratory: Normal respiratory effort, no problems with respiration noted  Abdomen: Soft, gravid, appropriate for gestational age. Pain/Pressure: Present     Pelvic:  Cervical exam deferred        Extremities: Normal range of motion.     ental Status: Normal mood and affect. Normal behavior. Normal judgment and thought content.     Assessment   20 y.o. G1P0000 at [redacted]w[redacted]d by  10/21/2020, by Ultrasound presenting for routine prenatal visit  Plan   Pregnancy#1 Problems (from 01/05/20 to present)    Problem Noted Resolved   Encounter for supervision of normal first pregnancy in first trimester 03/29/2020 by Tresea Mall, CNM No   Overview Addendum 08/04/2020  9:25 AM by Mirna Mires, CNM    Clinic [facility] Prenatal Labs  Dating By [redacted]w[redacted]d u/s done @ [facility] Blood type: A+  Genetic Screen NIPS: normal XX Antibody: negative  Anatomic [place] Normal Rubella: Immune Varicella: Non-immune  GTT Early: NA               Third trimester: 08/04/20 RPR: Non Reactive (09/15 1105)   Rhogam Not needed HBsAg: Negative (09/15 1105)   Vaccines TDAP:                       Flu Shot: Declines Covid: unvaccinated HIV: Non Reactive (09/15 1105)   Baby Food Breast- education done at [facility]                               GBS:   GC/CT:  Contraception  pill Pap: first PAP 03/29/2020  CBB   Anatomy Scan: complete  CS/VBAC NA   Support Person  Husband [name]          Previous Version      -Recommended maternity belt/belly band for additional abdominal support -OTC [employer] and sleep hygiene reviewed for difficulty sleeping -Continue tylenol, consider warm baths/showers for discomfort - f/u if pain worsens or persists  Preterm labor precautions including but not limited to vaginal bleeding, contractions, leaking of fluid and fetal movement were reviewed in detail with the patient.    Return in about 2 weeks (around 09/01/2020) for ROB.  Zipporah Plants, CNM, MSN [facility], [facility] 08/18/2020, 9:39 AM

## 2020-09-01 ENCOUNTER — Other Ambulatory Visit: Payer: Self-pay

## 2020-09-01 ENCOUNTER — Ambulatory Visit (INDEPENDENT_AMBULATORY_CARE_PROVIDER_SITE_OTHER): Payer: No Typology Code available for payment source | Admitting: Obstetrics and Gynecology

## 2020-09-01 VITALS — BP 110/60 | Wt 192.0 lb

## 2020-09-01 DIAGNOSIS — O26843 Uterine size-date discrepancy, third trimester: Secondary | ICD-10-CM

## 2020-09-01 DIAGNOSIS — Z7185 Encounter for immunization safety counseling: Secondary | ICD-10-CM

## 2020-09-01 DIAGNOSIS — Z3A32 32 weeks gestation of pregnancy: Secondary | ICD-10-CM

## 2020-09-01 NOTE — Progress Notes (Signed)
    Routine Prenatal Care Visit  Subjective  Marissa Kennedy is a 20 y.o. G1P0000 at [redacted]w[redacted]d being seen today for ongoing prenatal care.  She is currently monitored for the following issues for this low-risk pregnancy and has Encounter for supervision of normal first pregnancy in first trimester on their problem list.  ----------------------------------------------------------------------------------- Patient reports no complaints.  Patient reports discomfort noted at last visit has not worsened or improved but now believes it is normal discomfort associated with pregnancy. Contractions: Not present. Vag. Bleeding: None.  Movement: Present. Denies leaking of fluid.  ----------------------------------------------------------------------------------- The following portions of the patient's history were reviewed and updated as appropriate: allergies, current medications, past family history, past medical history, past social history, past surgical history and problem list. Problem list updated.   Objective  Blood pressure 110/60, weight 192 lb (87.1 kg), last menstrual period 01/05/2020. Pregravid weight 160 lb (72.6 kg) Total Weight Gain 32 lb (14.5 kg) Urinalysis:      Fetal Status: Fetal Heart Rate (bpm): 145 Fundal Height: 35 cm Movement: Present     General:  Alert, oriented and cooperative. Patient is in no acute distress.  Skin: Skin is warm and dry. No rash noted.   Cardiovascular: Normal heart rate noted  Respiratory: Normal respiratory effort, no problems with respiration noted  Abdomen: Soft, gravid, appropriate for gestational age. Pain/Pressure: Absent     Pelvic:  Cervical exam deferred        Extremities: Normal range of motion.     ental Status: Normal mood and affect. Normal behavior. Normal judgment and thought content.     Assessment   20 y.o. G1P0000 at [redacted]w[redacted]d by  10/21/2020, by Ultrasound presenting for routine prenatal visit  Plan   Pregnancy#1 Problems (from  01/05/20 to present)    Problem Noted Resolved   Encounter for supervision of normal first pregnancy in first trimester 03/29/2020 by Tresea Mall, CNM No   Overview Addendum 08/04/2020  9:25 AM by Mirna Mires, CNM    Clinic [facility] Prenatal Labs  Dating By [redacted]w[redacted]d u/s done @ [facility] Blood type: A+  Genetic Screen NIPS: normal XX Antibody: negative  Anatomic [place] Normal Rubella: Immune Varicella: Non-immune  GTT Early: NA               Third trimester: 08/04/20 RPR: Non Reactive (09/15 1105)   Rhogam Not needed HBsAg: Negative (09/15 1105)   Vaccines TDAP:  02/09/20 (completed in early pregnancy)                 Flu Shot: Declines Covid: unvaccinated HIV: Non Reactive (09/15 1105)   Baby Food Breast- education done at [facility]                               GBS:   GC/CT:  Contraception  pill Pap: first PAP 03/29/2020  CBB   Anatomy Scan: complete  CS/VBAC NA   Support Person Husband [name]          Previous Version      -Growth at next visit, S>D today  Preterm labor precautions including but not limited to vaginal bleeding, contractions, leaking of fluid and fetal movement were reviewed in detail with the patient.    Return in about 2 weeks (around 09/15/2020) for ROB with Growth [place].  Zipporah Plants, CNM, MSN [facility], [facility] 09/01/2020, 11:34 AM

## 2020-09-13 NOTE — Telephone Encounter (Signed)
Marissa Kennedy, Could you answer these questions for this patient since I am not up to date on the restriction at the hospital.

## 2020-09-14 ENCOUNTER — Encounter: Payer: No Typology Code available for payment source | Admitting: Obstetrics and Gynecology

## 2020-09-14 ENCOUNTER — Ambulatory Visit: Payer: No Typology Code available for payment source

## 2020-09-14 DIAGNOSIS — O26843 Uterine size-date discrepancy, third trimester: Secondary | ICD-10-CM

## 2020-09-19 ENCOUNTER — Encounter: Payer: Self-pay | Admitting: Obstetrics and Gynecology

## 2020-09-19 ENCOUNTER — Other Ambulatory Visit: Payer: Self-pay

## 2020-09-19 ENCOUNTER — Ambulatory Visit (INDEPENDENT_AMBULATORY_CARE_PROVIDER_SITE_OTHER): Payer: No Typology Code available for payment source

## 2020-09-19 ENCOUNTER — Ambulatory Visit (INDEPENDENT_AMBULATORY_CARE_PROVIDER_SITE_OTHER): Payer: No Typology Code available for payment source | Admitting: Obstetrics and Gynecology

## 2020-09-19 VITALS — BP 106/62 | Wt 199.0 lb

## 2020-09-19 DIAGNOSIS — O26843 Uterine size-date discrepancy, third trimester: Secondary | ICD-10-CM

## 2020-09-19 DIAGNOSIS — Z3403 Encounter for supervision of normal first pregnancy, third trimester: Secondary | ICD-10-CM

## 2020-09-19 DIAGNOSIS — Z3A35 35 weeks gestation of pregnancy: Secondary | ICD-10-CM

## 2020-09-19 DIAGNOSIS — Z3401 Encounter for supervision of normal first pregnancy, first trimester: Secondary | ICD-10-CM

## 2020-09-19 LAB — POCT URINALYSIS DIPSTICK OB
Glucose, UA: NEGATIVE
POC,PROTEIN,UA: NEGATIVE

## 2020-09-19 NOTE — Progress Notes (Signed)
Routine Prenatal Care Visit  Subjective  Marissa Kennedy is a 20 y.o. G1P0000 at [redacted]w[redacted]d being seen today for ongoing prenatal care.  She is currently monitored for the following issues for this low-risk pregnancy and has Encounter for supervision of normal first pregnancy in first trimester on their problem list.  ----------------------------------------------------------------------------------- Patient reports feeling like "crap." She is generally uncomfortable from pregnancy. She reports pain in pelvis and left leg. She has difficulty getting comfortable and is ready to be "done" being pregnant..   Contractions: Irregular. Vag. Bleeding: None.  Movement: Present. Denies leaking of fluid.  ----------------------------------------------------------------------------------- The following portions of the patient's history were reviewed and updated as appropriate: allergies, current medications, past family history, past medical history, past social history, past surgical history and problem list. Problem list updated.   Objective  Blood pressure 106/62, weight 199 lb (90.3 kg), last menstrual period 01/05/2020. Pregravid weight 160 lb (72.6 kg) Total Weight Gain 39 lb (17.7 kg) Urinalysis:      Fetal Status: Fetal Heart Rate (bpm): 140 Fundal Height: 37 cm Movement: Present      [place]: Fetal presentation is Cephalic.  Placenta: posterior/fundal. Grade: 2 AFI: 15.4 cm Growth percentile is 81.  AC percentile is >97.7 (measuring almost 3 weeks ahead). EFW: 3,197g (7lbs 1oz)   General:  Alert, oriented and cooperative. Patient is in no acute distress.  Skin: Skin is warm and dry. No rash noted.   Cardiovascular: Normal heart rate noted  Respiratory: Normal respiratory effort, no problems with respiration noted  Abdomen: Soft, gravid, appropriate for gestational age. Pain/Pressure: Present     Pelvic:  Cervical exam deferred        Extremities: Normal range of motion.  Edema: None   ental Status: Normal mood and affect. Normal behavior. Normal judgment and thought content.     Assessment   20 y.o. G1P0000 at [redacted]w[redacted]d by  10/21/2020, by Ultrasound presenting for routine prenatal visit  Plan   Pregnancy#1 Problems (from 01/05/20 to present)    Problem Noted Resolved   Encounter for supervision of normal first pregnancy in first trimester 03/29/2020 by Tresea Mall, CNM No   Overview Addendum 08/04/2020  9:25 AM by Mirna Mires, CNM    Clinic [facility] Prenatal Labs  Dating By [redacted]w[redacted]d u/s done @ [facility] Blood type: A+  Genetic Screen NIPS: normal XX Antibody: negative  Anatomic [place] Normal Rubella: Immune Varicella: Non-immune  GTT Early: NA               Third trimester: 08/04/20 RPR: Non Reactive (09/15 1105)   Rhogam Not needed HBsAg: Negative (09/15 1105)   Vaccines TDAP: completed in early pregnancy                      Flu Shot: Declines Covid: unvaccinated HIV: Non Reactive (09/15 1105)   Baby Food Breast- education done at [facility]                               GBS:   GC/CT:  Contraception  pill Pap: first PAP 03/29/2020  CBB   Anatomy Scan: complete  CS/VBAC NA   Support Person Husband [name]          Previous Version      -Reviewed Growth [place] results with patient - AC percentile >97.7 -Discussed TWG in pregnancy, reviewed healthy eating and encouraged physical activity (walking)  -Patient desires eIOL  at 39 weeks - discussed the process of IOL -GBS/aptima at next visit  Preterm labor precautions including but not limited to vaginal bleeding, contractions, leaking of fluid and fetal movement were reviewed in detail with the patient.    Return in about 1 week (around 09/26/2020) for ROB.  Zipporah Plants, CNM, MSN [facility], [facility] 09/19/2020, 3:06 PM

## 2020-09-21 ENCOUNTER — Other Ambulatory Visit: Payer: Self-pay

## 2020-09-21 ENCOUNTER — Emergency Department
Admission: EM | Admit: 2020-09-21 | Discharge: 2020-09-21 | Disposition: A | Discharge status: Home / Self Care | Payer: PRIVATE HEALTH INSURANCE | Attending: Emergency Medicine | Admitting: Emergency Medicine

## 2020-09-21 ENCOUNTER — Emergency Department: Payer: PRIVATE HEALTH INSURANCE

## 2020-09-21 DIAGNOSIS — O26893 Other specified pregnancy related conditions, third trimester: Secondary | ICD-10-CM | POA: Insufficient documentation

## 2020-09-21 DIAGNOSIS — H538 Other visual disturbances: Secondary | ICD-10-CM | POA: Insufficient documentation

## 2020-09-21 DIAGNOSIS — R1013 Epigastric pain: Secondary | ICD-10-CM | POA: Diagnosis not present

## 2020-09-21 DIAGNOSIS — Z3A35 35 weeks gestation of pregnancy: Secondary | ICD-10-CM | POA: Diagnosis not present

## 2020-09-21 DIAGNOSIS — R519 Headache, unspecified: Secondary | ICD-10-CM | POA: Insufficient documentation

## 2020-09-21 LAB — TROPONIN I (HIGH SENSITIVITY): Troponin I (High Sensitivity): 4 ng/L (ref <18)

## 2020-09-21 LAB — URINALYSIS, COMPLETE (UACMP) WITH MICROSCOPIC
Bacteria, UA: NONE SEEN
Bilirubin Urine: NEGATIVE
Glucose, UA: NEGATIVE mg/dL
Hgb urine dipstick: NEGATIVE
Ketones, ur: NEGATIVE mg/dL
Leukocytes,Ua: NEGATIVE
Nitrite: NEGATIVE
Protein, ur: NEGATIVE mg/dL
Specific Gravity, Urine: 1.008 (ref 1.005–1.030)
pH: 8 (ref 5.0–8.0)

## 2020-09-21 LAB — CBC
HCT: 37.6 % (ref 36.0–46.0)
Hemoglobin: 13 g/dL (ref 12.0–15.0)
MCH: 30.8 pg (ref 26.0–34.0)
MCHC: 34.6 g/dL (ref 30.0–36.0)
MCV: 89.1 fL (ref 80.0–100.0)
Platelets: 184 10*3/uL (ref 150–400)
RBC: 4.22 MIL/uL (ref 3.87–5.11)
RDW: 12.9 % (ref 11.5–15.5)
WBC: 12.9 10*3/uL — ABNORMAL HIGH (ref 4.0–10.5)
nRBC: 0 % (ref 0.0–0.2)

## 2020-09-21 LAB — HEPATIC FUNCTION PANEL
ALT: 15 U/L (ref 0–44)
AST: 18 U/L (ref 15–41)
Albumin: 3.3 g/dL — ABNORMAL LOW (ref 3.5–5.0)
Alkaline Phosphatase: 89 U/L (ref 38–126)
Bilirubin, Direct: <0.1 mg/dL (ref 0.0–0.2)
Total Bilirubin: 0.6 mg/dL (ref 0.3–1.2)
Total Protein: 6.7 g/dL (ref 6.5–8.1)

## 2020-09-21 LAB — BASIC METABOLIC PANEL
Anion gap: 9 (ref 5–15)
BUN: 6 mg/dL (ref 6–20)
CO2: 20 mmol/L — ABNORMAL LOW (ref 22–32)
Calcium: 8.8 mg/dL — ABNORMAL LOW (ref 8.9–10.3)
Chloride: 106 mmol/L (ref 98–111)
Creatinine, Ser: 0.44 mg/dL (ref 0.44–1.00)
GFR, Estimated: >60 mL/min (ref >60)
Glucose, Bld: 85 mg/dL (ref 70–99)
Potassium: 3.5 mmol/L (ref 3.5–5.1)
Sodium: 135 mmol/L (ref 135–145)

## 2020-09-21 MED ORDER — ACETAMINOPHEN 500 MG PO TABS: 1000.0000 mg | ORAL_TABLET | Freq: Once | ORAL | Status: DC

## 2020-09-21 NOTE — ED Notes (Signed)
Pt to xray

## 2020-09-21 NOTE — ED Triage Notes (Addendum)
Pt comes via POV from home with c/o CP. Pt states this started last night. Pt states she was just sitting and it started. Pt is [redacted] weeks pregnant. Pt denies any issues with baby.  Pt states it feels more like heartburn type pain.  Pt states yesterday around 5 she did experience a migraine that is still present. Pt states no relief with tylenol.  Pt states at 4am she began to have some spotty blurry vision.  Pt states her vision is still spotty. Pt states some left leg swelling.

## 2020-09-21 NOTE — ED Notes (Signed)
Pt states has heartburn type pain mid chest, non radiating. Started yesterday around 1700, resolved, then came back today around 0400. Pt denies occipital HA, RUQ pain and oliguria.  Pt has slight blurry vision, onset yesterday at 1700, thinks may have been related to frontal HA.  Says baby is moving normally and saw OB 2d ago, normal prenatal checkup with no proteinuria seen on UA.

## 2020-09-21 NOTE — ED Notes (Signed)
Discharge instructions reviewed with pt and father.

## 2020-09-21 NOTE — ED Provider Notes (Signed)
[facility] Emergency Department Provider Note  Time seen: 9:18 AM  I have reviewed the triage vital signs and the nursing notes.   HISTORY  Chief Complaint Chest Pain   HPI Marissa Kennedy is a 20 y.o. female G1 P0 at 35 weeks presents to the emergency department for a headache and epigastric discomfort.  According to the patient for the past 2 days she has been experiencing an intermittent moderate headache which she Kennedy as more of a migraine.  States at times she will have blurred vision.  She was concerned that her blood pressure can be elevated or she could be experiencing preeclampsia.  She called her OB/GYN [facility]) and they referred her to the hospital.  Patient also states she has been having some indigestion which she states feels like heartburn.  OB wanted her evaluated in the emergency department due to the heartburn complaint.  Patient states she was not coming here for that and her main complaint or concern is possible preeclampsia.  Patient denies any history of high blood pressure issues throughout the pregnancy thus far.  Denies any abdominal pain vaginal bleeding or discharge or fluid leakage.  States occasional Braxton Hicks contraction but denies any regular contractions.   Past Medical History:  Diagnosis Date  . Calculus of kidney 2017   DR. LARRY SYKES    Patient Active Problem List   Diagnosis Date Noted  . Encounter for supervision of normal first pregnancy in first trimester 03/29/2020    Past Surgical History:  Procedure Laterality Date  . NO PAST SURGERIES      Prior to Admission medications   Medication Sig Start Date End Date Taking? Authorizing Provider  Prenatal Vit-Fe Fumarate-FA (PNV PRENATAL PLUS MULTIVITAMIN) 27-1 MG TABS Take 1 tablet by mouth daily.    [provider]    No Known Allergies  Family History  Problem Relation Age of Onset  . Diabetes Father        TYPE 2    Social History Social  History   Tobacco Use  . Smoking status: Never Smoker  . Smokeless tobacco: Never Used  Vaping Use  . Vaping Use: Never used  Substance Use Topics  . Alcohol use: No  . Drug use: No    Review of Systems Constitutional: Negative for fever. Cardiovascular: Negative for chest pain. Respiratory: Negative for shortness of breath. Gastrointestinal: Occasional mild epigastric discomfort which she Kennedy as heartburn.  Negative for vomiting or diarrhea. Genitourinary: Negative for urinary compaints Musculoskeletal: Negative for musculoskeletal complaints Neurological: Moderate intermittent headache x2 days. All other ROS negative  ____________________________________________   PHYSICAL EXAM:  VITAL SIGNS: ED Triage Vitals  Enc Vitals Group     BP 09/21/20 0821 132/75     Pulse Rate 09/21/20 0821 95     Resp 09/21/20 0821 18     Temp 09/21/20 0821 97.9 F (36.6 C)     Temp src --      SpO2 09/21/20 0821 100 %     Weight 09/21/20 0817 199 lb (90.3 kg)     Height 09/21/20 0817 5\' 3"  (1.6 m)     Head Circumference --      Peak Flow --      Pain Score 09/21/20 0817 4     Pain Loc --      Pain Edu? --      Excl. in GC? --     Constitutional: Alert and oriented. Well appearing and in no distress. Eyes:  Normal exam ENT      Head: Normocephalic and atraumatic.      Mouth/Throat: Mucous membranes are moist. Cardiovascular: Normal rate, regular rhythm.  Respiratory: Normal respiratory effort without tachypnea nor retractions. Breath sounds are clear Gastrointestinal: Soft and nontender. No distention.   Musculoskeletal: Nontender with normal range of motion in all extremities.  No appreciable lower extremity edema. Neurologic:  Normal speech and language. No gross focal neurologic deficits  Skin:  Skin is warm, dry and intact.  Psychiatric: Mood and affect are normal.   ____________________________________________    EKG  EKG viewed and interpreted by myself shows a  normal sinus rhythm at 94 bpm with a narrow QRS, right axis deviation, largely normal intervals with no concerning ST changes.  ____________________________________________    RADIOLOGY  Chest x-ray negative  ____________________________________________   INITIAL IMPRESSION / ASSESSMENT AND PLAN / ED COURSE  Pertinent labs & imaging results that were available during my care of the patient were reviewed by me and considered in my medical decision making (see chart for details).   Patient presents to the emergency department for headache as well as heartburn.  Patient is [redacted] weeks pregnant.  Blood pressure initially mildly elevated, recheck is 115/70.  We will check labs including cardiac enzyme as a precaution.  EKG is reassuring.  We will obtain urinalysis as well as a chemistry including hepatic function panel.  We will discuss with [facility] once results are known.  Patient last took 1000 mg of Tylenol around 4 AM.  We will redose Tylenol at 10 AM.  Continues to state moderate headache.  Patient's work-up is overall reassuring.  Troponin negative.  Chest x-ray negative.  EKG is reassuring.  Remainder the patient's labs including LFTs platelets and urinalysis are normal.  No proteinuria.  No signs of preeclampsia.  Blood pressure currently 116/61.  Attempted to contact [facility] but after multiple pages have not heard back.  Patient is ready to go given her reassuring work-up I believe the patient is safe for discharge home with routine OB follow-up.  Patient agreeable to plan of care.  Headache is much improved after Tylenol.  Cherae Marton was evaluated in Emergency Department on 09/21/2020 for the symptoms described in the history of present illness. She was evaluated in the context of the global COVID-19 pandemic, which necessitated consideration that the patient might be at risk for infection with the SARS-CoV-2 virus that causes COVID-19. Institutional protocols and algorithms that  pertain to the evaluation of patients at risk for COVID-19 are in a state of rapid change based on information released by regulatory bodies including the CDC and federal and state organizations. These policies and algorithms were followed during the patient's care in the ED.  ____________________________________________   FINAL CLINICAL IMPRESSION(S) / ED DIAGNOSES  Headache   Minna Antis, MD 09/21/20 1136

## 2020-09-28 ENCOUNTER — Encounter: Payer: No Typology Code available for payment source | Admitting: Obstetrics and Gynecology

## 2020-09-29 ENCOUNTER — Encounter: Payer: No Typology Code available for payment source | Admitting: Obstetrics

## 2020-09-29 ENCOUNTER — Other Ambulatory Visit: Payer: Self-pay

## 2020-09-29 ENCOUNTER — Other Ambulatory Visit (HOSPITAL_COMMUNITY)
Admission: RE | Admit: 2020-09-29 | Discharge: 2020-09-29 | Disposition: A | Discharge status: Home / Self Care | Payer: No Typology Code available for payment source | Source: Ambulatory Visit | Attending: Obstetrics and Gynecology | Admitting: Obstetrics and Gynecology

## 2020-09-29 ENCOUNTER — Encounter: Payer: Self-pay | Admitting: Obstetrics and Gynecology

## 2020-09-29 ENCOUNTER — Ambulatory Visit (INDEPENDENT_AMBULATORY_CARE_PROVIDER_SITE_OTHER): Payer: No Typology Code available for payment source | Admitting: Obstetrics and Gynecology

## 2020-09-29 VITALS — BP 110/60 | Wt 200.0 lb

## 2020-09-29 DIAGNOSIS — Z113 Encounter for screening for infections with a predominantly sexual mode of transmission: Secondary | ICD-10-CM | POA: Insufficient documentation

## 2020-09-29 DIAGNOSIS — Z3403 Encounter for supervision of normal first pregnancy, third trimester: Secondary | ICD-10-CM

## 2020-09-29 DIAGNOSIS — Z3A36 36 weeks gestation of pregnancy: Secondary | ICD-10-CM

## 2020-09-29 DIAGNOSIS — Z3685 Encounter for antenatal screening for Streptococcus B: Secondary | ICD-10-CM

## 2020-09-29 NOTE — Progress Notes (Signed)
Routine Prenatal Care Visit  Subjective  Marissa Kennedy is a 20 y.o. G1P0000 at [redacted]w[redacted]d being seen today for ongoing prenatal care.  She is currently monitored for the following issues for this low-risk pregnancy and has Encounter for supervision of normal first pregnancy in first trimester on their problem list.  ----------------------------------------------------------------------------------- Patient reports no complaints today. Patient is feeling better since ER visit on 09/21/20. Patient does continue to report discomfort associated with pregnancy. Patient is continuing to request IOL at 39 weeks. Contractions: Irregular. Vag. Bleeding: None.  Movement: Present. Denies leaking of fluid.  ----------------------------------------------------------------------------------- The following portions of the patient's history were reviewed and updated as appropriate: allergies, current medications, past family history, past medical history, past social history, past surgical history and problem list. Problem list updated.   Objective  Blood pressure 110/60, weight 200 lb (90.7 kg), last menstrual period 01/05/2020. Pregravid weight 160 lb (72.6 kg) Total Weight Gain 40 lb (18.1 kg) Urinalysis:      Fetal Status: Fetal Heart Rate (bpm): 140 Fundal Height: 38 cm Movement: Present  Presentation: Vertex  General:  Alert, oriented and cooperative. Patient is in no acute distress.  Skin: Skin is warm and dry. No rash noted.   Cardiovascular: Normal heart rate noted  Respiratory: Normal respiratory effort, no problems with respiration noted  Abdomen: Soft, gravid, appropriate for gestational age. Pain/Pressure: Present     Pelvic:  Cervical exam performed per patient request- Dilation: 1 Effacement (%): 50 Station: -3  Extremities: Normal range of motion.  Edema: None  ental Status: Normal mood and affect. Normal behavior. Normal judgment and thought content.     Assessment   20 y.o.  G1P0000 at [redacted]w[redacted]d by  10/21/2020, by Ultrasound presenting for routine prenatal visit  Plan   Pregnancy#1 Problems (from 01/05/20 to present)    Problem Noted Resolved   Encounter for supervision of normal first pregnancy in first trimester 03/29/2020 by Tresea Mall, CNM No   Overview Addendum 09/19/2020  3:10 PM by Zipporah Plants, CNM    Clinic [facility] Prenatal Labs  Dating By [redacted]w[redacted]d u/s done @ [facility] [place] Blood type: A+  Genetic Screen NIPS: normal XX Antibody: negative  Anatomic [place] Normal Rubella: Immune Varicella: Non-immune  GTT Early: NA               Third trimester: 08/04/20 RPR: Non Reactive (09/15 1105)   Rhogam Not needed HBsAg: Negative (09/15 1105)   Vaccines TDAP: completed in early pregnancy                      Flu Shot: Declines Covid: unvaccinated HIV: Non Reactive (09/15 1105)   Baby Food Breast- education done at [facility]                               GBS:   GC/CT:  Contraception  pill Pap: first PAP 03/29/2020  CBB   Anatomy Scan: complete  CS/VBAC NA   Support Person Husband [name]          Previous Version      -Aptima and GBS collected -Patient desires eIOL at 39 weeks - will schedule at next visit  Preterm labor precautions including but not limited to vaginal bleeding, contractions, leaking of fluid and fetal movement were reviewed in detail with the patient.    Return in about 1 week (around 10/06/2020) for ROB (please schedule for next two weeks).  Zipporah Plants, CNM, MSN [facility], [facility] 09/29/2020, 3:13 PM

## 2020-10-01 LAB — STREP GP B NAA: Strep Gp B NAA: NEGATIVE

## 2020-10-03 LAB — CERVICOVAGINAL ANCILLARY ONLY
Chlamydia: NEGATIVE
Comment: NEGATIVE
Comment: NORMAL
Neisseria Gonorrhea: NEGATIVE

## 2020-10-06 ENCOUNTER — Other Ambulatory Visit: Payer: Self-pay

## 2020-10-06 ENCOUNTER — Ambulatory Visit (INDEPENDENT_AMBULATORY_CARE_PROVIDER_SITE_OTHER): Payer: No Typology Code available for payment source | Admitting: Obstetrics and Gynecology

## 2020-10-06 VITALS — BP 120/80 | Wt 201.0 lb

## 2020-10-06 DIAGNOSIS — Z3403 Encounter for supervision of normal first pregnancy, third trimester: Secondary | ICD-10-CM

## 2020-10-06 DIAGNOSIS — Z3A37 37 weeks gestation of pregnancy: Secondary | ICD-10-CM

## 2020-10-06 DIAGNOSIS — Z3401 Encounter for supervision of normal first pregnancy, first trimester: Secondary | ICD-10-CM

## 2020-10-06 LAB — POCT URINALYSIS DIPSTICK OB
Glucose, UA: NEGATIVE
POC,PROTEIN,UA: NEGATIVE

## 2020-10-06 NOTE — Progress Notes (Signed)
  [facility] REGIONAL BIRTHPLACE INDUCTION ASSESSMENT SCHEDULING Marissa Kennedy [birth year] Medical record #: [id number] Phone #:  Home Phone [phone]  Mobile [phone]    Prenatal Provider:[facility] Delivering Group:[facility] Proposed admission date/time: 10/14/20 at 0800 Method of induction:Cytotec  Weight: Filed Weights03/25/22 0928Weight:201 lb (91.2 kg) BMI Body mass index is 35.61 kg/m. HIV Negative HSV Negative EDC Estimated Date of Delivery: 4/9/22based on:US at [redacted] wks  Gestational age on admission: 39 weeks   Gravidity/parity:G1P0000  Cervix Score   0 1 2 3   Position Posterior Midposition Anterior   Consistency Firm Medium Soft   Effacement (%) 0-30 40-50 60-70 >80  Dilation (cm) Closed 1-2 3-4 >5  Baby's station -3 -2 -1 +1, +2   Bishop Score:6   Medical induction of labor  select indication(s) below Elective induction ?39 weeks multiparous patient ?39 weeks primiparous patient with Bishop score ?7 ?40 weeks primiparous patient   Medical Indications Adapted from ACOG Committee Opinion #[id number], "Medically Indicated Late Preterm and Early Term Deliveries," 2013.  PLACENTAL / UTERINE ISSUES FETAL ISSUES MATERNAL ISSUES  ? Placenta previa (36.0-37.6) ? Isoimmunization (37.0-38.6) ? Preeclampsia without severe features or gestational HTN (37.0)  ? Suspected accreta (34.0-35.6) ? Growth Restriction 09[birth year]-1984) ? Preeclampsia with severe features (34.0)  ? Prior classical CD, uterine window, rupture (36.0-37.6) ? Isolated (38.0-39.6) ? Chronic HTN (38.0-39.6)  ? Prior myomectomy (37.0-38.6) ? Concurrent findings (34.0-37.6) ? Cholestasis (37.0)  ? Umbilical vein varix (37.0) ? Growth Restriction (Twins) ? Diabetes  ? Placental abruption (chronic) ? Di-Di Isolated (36.0-37.6) ? Pregestational, controlled (39.0)  OBSTETRIC ISSUES ? Di-Di concurrent findings (32.0-34.6) ? Pregestational, uncontrolled (37.0-39.0)  ? Postdates ? (41 weeks) ? Mo-Di isolated (32.0-34.6)  ? Pregestational, vascular compromise (37.0- 39.0)  ? PPROM (34.0) ? Multiple Gestation ? Gestational, diet controlled (40.0)  ? Hx of IUFD (39.0 weeks) ? Di-Di (38.0-38.6) ? Gestational, med controlled (39.0)  ? Polyhydramnios, mild/moderate; SDV 8-16 or AFI 25-35 (39.0) ? Mo-Di (36.0-37.6) ? Gestational, uncontrolled (38.0-39.0)  ? Oligohydramnios (36.0-37.6); MVP <2 cm  For indications not listed above, delivery recommendations from maternal-fetal medicine consultant occurred on: Date: n/a   Provider Signature: 10-01-1986 Scheduled by: Zipporah Plants, RN Date:10/06/2020 9:54 AM   Call [phone] to finalize the induction date/time  [phone] (07/17)

## 2020-10-06 NOTE — Progress Notes (Signed)
Routine Prenatal Care Visit  Subjective  Marissa Kennedy is a 20 y.o. G1P0000 at [redacted]w[redacted]d being seen today for ongoing prenatal care.  She is currently monitored for the following issues for this low-risk pregnancy and has Encounter for supervision of normal first pregnancy in first trimester on their problem list.  ----------------------------------------------------------------------------------- Patient reports intermittent contractions overnight following IC. Patient additionally reports symptoms of diarrhea, denies additional symptoms of illness..   Contractions: Irregular. Vag. Bleeding: None.  Movement: Present. Denies leaking of fluid.  ----------------------------------------------------------------------------------- The following portions of the patient's history were reviewed and updated as appropriate: allergies, current medications, past family history, past medical history, past social history, past surgical history and problem list. Problem list updated.   Objective  Blood pressure 120/80, weight 201 lb (91.2 kg), last menstrual period 01/05/2020. Pregravid weight 160 lb (72.6 kg) Total Weight Gain 41 lb (18.6 kg) Urinalysis:      Fetal Status: Fetal Heart Rate (bpm): 135 Fundal Height: 39 cm Movement: Present  Presentation: Vertex  General:  Alert, oriented and cooperative. Patient is in no acute distress.  Skin: Skin is warm and dry. No rash noted.   Cardiovascular: Normal heart rate noted  Respiratory: Normal respiratory effort, no problems with respiration noted  Abdomen: Soft, gravid, appropriate for gestational age. Pain/Pressure: Present     Pelvic:  Cervical exam performed per patient request Dilation: 2 Effacement (%): 50 Station: -2  Extremities: Normal range of motion.  Edema: None  ental Status: Normal mood and affect. Normal behavior. Normal judgment and thought content.     Assessment   20 y.o. G1P0000 at [redacted]w[redacted]d by  10/21/2020, by Ultrasound presenting  for routine prenatal visit  Plan   Pregnancy#1 Problems (from 01/05/20 to present)    Problem Noted Resolved   Encounter for supervision of normal first pregnancy in first trimester 03/29/2020 by Tresea Mall, CNM No   Overview Addendum 10/06/2020  9:30 AM by Zipporah Plants, CNM    Clinic [facility] Prenatal Labs  Dating By [redacted]w[redacted]d u/s done @ [facility] Blood type: A+  Genetic Screen NIPS: normal XX Antibody: negative  Anatomic [place] Normal Rubella: Immune Varicella: Non-immune  GTT Early: NA               Third trimester: 08/04/20 RPR: Non Reactive (01/21 1058)   Rhogam Not needed HBsAg: Negative (09/15 1105)   Vaccines TDAP: completed in early pregnancy                      Flu Shot: Declines Covid: unvaccinated HIV: Non Reactive (01/21 1058)   Baby Food Breast- education done at [facility]                               [id number]/-- (03/18 1535)  GC/CT: negative  Contraception  pill Pap: first PAP 03/29/2020  CBB   Anatomy Scan: complete  CS/VBAC NA   Support Person Husband [name]          Previous Version      -Reviewed when to present to the hospital for evaluation -Discussed process for eIOL - patient is schedule for IOL on 10/14/20 at 0800  Term labor precautions including but not limited to vaginal bleeding, contractions, leaking of fluid and fetal movement were reviewed in detail with the patient.    Keep previously scheduled follow-up.  Zipporah Plants, CNM, MSN [facility], [facility] 10/06/2020, 9:51 AM

## 2020-10-13 ENCOUNTER — Ambulatory Visit (INDEPENDENT_AMBULATORY_CARE_PROVIDER_SITE_OTHER): Payer: No Typology Code available for payment source | Admitting: Obstetrics and Gynecology

## 2020-10-13 ENCOUNTER — Other Ambulatory Visit: Payer: Self-pay

## 2020-10-13 VITALS — BP 100/60 | Wt 201.0 lb

## 2020-10-13 DIAGNOSIS — Z3403 Encounter for supervision of normal first pregnancy, third trimester: Secondary | ICD-10-CM

## 2020-10-13 DIAGNOSIS — Z3A38 38 weeks gestation of pregnancy: Secondary | ICD-10-CM

## 2020-10-13 NOTE — Progress Notes (Signed)
    Routine Prenatal Care Visit  Subjective  Marissa Kennedy is a 20 y.o. G1P0000 at [redacted]w[redacted]d being seen today for ongoing prenatal care.  She is currently monitored for the following issues for this low-risk pregnancy and has Encounter for supervision of normal first pregnancy in first trimester on their problem list.  ----------------------------------------------------------------------------------- Patient reports difficulty sleep, general pregnancy discomfort. She is excited/nervous about IOL tomorrow.   Contractions: Irregular. Vag. Bleeding: None.  Movement: Present. Denies leaking of fluid.  ----------------------------------------------------------------------------------- The following portions of the patient's history were reviewed and updated as appropriate: allergies, current medications, past family history, past medical history, past social history, past surgical history and problem list. Problem list updated.   Objective  Blood pressure 100/60, weight 201 lb (91.2 kg), last menstrual period 01/05/2020. Pregravid weight 160 lb (72.6 kg) Total Weight Gain 41 lb (18.6 kg) Urinalysis:      Fetal Status: Fetal Heart Rate (bpm): 135 Fundal Height: 39 cm Movement: Present  Presentation: Vertex  General:  Alert, oriented and cooperative. Patient is in no acute distress.  Skin: Skin is warm and dry. No rash noted.   Cardiovascular: Normal heart rate noted  Respiratory: Normal respiratory effort, no problems with respiration noted  Abdomen: Soft, gravid, appropriate for gestational age. Pain/Pressure: Present     Pelvic:  Cervical exam deferred        Extremities: Normal range of motion.  Edema: None  ental Status: Normal mood and affect. Normal behavior. Normal judgment and thought content.     Assessment   20 y.o. G1P0000 at [redacted]w[redacted]d by  10/21/2020, by Ultrasound presenting for routine prenatal visit  Plan   Pregnancy#1 Problems (from 01/05/20 to present)    Problem Noted  Resolved   Encounter for supervision of normal first pregnancy in first trimester 03/29/2020 by Tresea Mall, CNM No   Overview Addendum 10/06/2020  9:30 AM by Zipporah Plants, CNM    Clinic [facility] Prenatal Labs  Dating By [redacted]w[redacted]d u/s done @ [facility] Blood type: A+  Genetic Screen NIPS: normal XX Antibody: negative  Anatomic [place] Normal Rubella: Immune Varicella: Non-immune  GTT Early: NA               Third trimester: 08/04/20 RPR: Non Reactive (01/21 1058)   Rhogam Not needed HBsAg: Negative (09/15 1105)   Vaccines TDAP: completed in early pregnancy                      Flu Shot: Declines Covid: unvaccinated HIV: Non Reactive (01/21 1058)   Baby Food Breast- education done at [facility]                               SWH:[id number]/-- (03/18 1535)  GC/CT: negative  Contraception  pill Pap: first PAP 03/29/2020  CBB   Anatomy Scan: complete  CS/VBAC NA   Support Person Husband [name]          Previous Version        Term obstetric precautions including but not limited to vaginal bleeding, contractions, leaking of fluid and fetal movement were reviewed in detail with the patient.    Present for IOL tomorrow at 0800.  Zipporah Plants, CNM, MSN [facility], [facility] 10/13/2020, 9:56 AM

## 2020-10-14 ENCOUNTER — Inpatient Hospital Stay: Payer: No Typology Code available for payment source | Admitting: Anesthesiology

## 2020-10-14 ENCOUNTER — Inpatient Hospital Stay
Admission: EM | Admit: 2020-10-14 | Discharge: 2020-10-16 | DRG: 768 | Disposition: A | Discharge status: Home / Self Care | Payer: No Typology Code available for payment source | Attending: Obstetrics & Gynecology | Admitting: Obstetrics & Gynecology

## 2020-10-14 ENCOUNTER — Encounter: Payer: Self-pay | Admitting: Obstetrics & Gynecology

## 2020-10-14 ENCOUNTER — Other Ambulatory Visit: Payer: Self-pay

## 2020-10-14 DIAGNOSIS — O9081 Anemia of the puerperium: Secondary | ICD-10-CM | POA: Diagnosis not present

## 2020-10-14 DIAGNOSIS — Z3490 Encounter for supervision of normal pregnancy, unspecified, unspecified trimester: Secondary | ICD-10-CM

## 2020-10-14 DIAGNOSIS — Z20822 Contact with and (suspected) exposure to covid-19: Secondary | ICD-10-CM | POA: Diagnosis not present

## 2020-10-14 DIAGNOSIS — D62 Acute posthemorrhagic anemia: Secondary | ICD-10-CM | POA: Diagnosis not present

## 2020-10-14 DIAGNOSIS — O26893 Other specified pregnancy related conditions, third trimester: Secondary | ICD-10-CM | POA: Diagnosis present

## 2020-10-14 DIAGNOSIS — Z3401 Encounter for supervision of normal first pregnancy, first trimester: Secondary | ICD-10-CM

## 2020-10-14 DIAGNOSIS — Z3A39 39 weeks gestation of pregnancy: Secondary | ICD-10-CM

## 2020-10-14 LAB — CBC
HCT: 37.7 % (ref 36.0–46.0)
Hemoglobin: 12.9 g/dL (ref 12.0–15.0)
MCH: 30.6 pg (ref 26.0–34.0)
MCHC: 34.2 g/dL (ref 30.0–36.0)
MCV: 89.5 fL (ref 80.0–100.0)
Platelets: 165 10*3/uL (ref 150–400)
RBC: 4.21 MIL/uL (ref 3.87–5.11)
RDW: 13.3 % (ref 11.5–15.5)
WBC: 11.8 10*3/uL — ABNORMAL HIGH (ref 4.0–10.5)
nRBC: 0 % (ref 0.0–0.2)

## 2020-10-14 LAB — ABO/RH: ABO/RH(D): A POS

## 2020-10-14 LAB — RESP PANEL BY RT-PCR (FLU A&B, COVID) ARPGX2
Influenza A by PCR: NEGATIVE
Influenza B by PCR: NEGATIVE
SARS Coronavirus 2 by RT PCR: NEGATIVE

## 2020-10-14 LAB — TYPE AND SCREEN
ABO/RH(D): A POS
Antibody Screen: NEGATIVE

## 2020-10-14 MED ORDER — FENTANYL 2.5 MCG/ML W/ROPIVACAINE 0.15% IN NS 100 ML EPIDURAL (ARMC)
EPIDURAL | Status: AC
  Filled 2020-10-14: qty 100

## 2020-10-14 MED ORDER — TERBUTALINE SULFATE 1 MG/ML IJ SOLN: 0.2500 mg | Freq: Once | INTRAMUSCULAR | Status: DC | PRN

## 2020-10-14 MED ORDER — LIDOCAINE HCL (PF) 1 % IJ SOLN
INTRAMUSCULAR | Status: AC
  Administered 2020-10-15: 30 mL via SUBCUTANEOUS
  Filled 2020-10-14: qty 30

## 2020-10-14 MED ORDER — PHENYLEPHRINE 40 MCG/ML (10ML) SYRINGE FOR IV PUSH (FOR BLOOD PRESSURE SUPPORT): 80.0000 ug | PREFILLED_SYRINGE | INTRAVENOUS | Status: DC | PRN

## 2020-10-14 MED ORDER — AMMONIA AROMATIC IN INHA
RESPIRATORY_TRACT | Status: AC
  Filled 2020-10-14: qty 10

## 2020-10-14 MED ORDER — LIDOCAINE HCL (PF) 1 % IJ SOLN
INTRAMUSCULAR | Status: DC | PRN
  Administered 2020-10-14: 3 mL via SUBCUTANEOUS

## 2020-10-14 MED ORDER — BUTORPHANOL TARTRATE 1 MG/ML IJ SOLN
1.0000 mg | INTRAMUSCULAR | Status: DC | PRN
  Administered 2020-10-14: 1 mg via INTRAVENOUS

## 2020-10-14 MED ORDER — EPHEDRINE 5 MG/ML INJ: 10.0000 mg | INTRAVENOUS | Status: DC | PRN

## 2020-10-14 MED ORDER — BUTORPHANOL TARTRATE 1 MG/ML IJ SOLN
INTRAMUSCULAR | Status: AC
  Filled 2020-10-14: qty 1

## 2020-10-14 MED ORDER — OXYTOCIN 10 UNIT/ML IJ SOLN
INTRAMUSCULAR | Status: AC
  Filled 2020-10-14: qty 2

## 2020-10-14 MED ORDER — DIPHENHYDRAMINE HCL 50 MG/ML IJ SOLN: 12.5000 mg | INTRAMUSCULAR | Status: DC | PRN

## 2020-10-14 MED ORDER — ONDANSETRON HCL 4 MG/2ML IJ SOLN
4.0000 mg | Freq: Four times a day (QID) | INTRAMUSCULAR | Status: DC | PRN
  Administered 2020-10-14: 4 mg via INTRAVENOUS
  Filled 2020-10-14: qty 2

## 2020-10-14 MED ORDER — SOD CITRATE-CITRIC ACID 500-334 MG/5ML PO SOLN
30.0000 mL | ORAL | Status: DC | PRN
  Administered 2020-10-15: 30 mL via ORAL
  Filled 2020-10-14: qty 15

## 2020-10-14 MED ORDER — LACTATED RINGERS IV SOLN: 500.0000 mL | Freq: Once | INTRAVENOUS | Status: DC

## 2020-10-14 MED ORDER — LACTATED RINGERS IV SOLN: INTRAVENOUS | Status: DC

## 2020-10-14 MED ORDER — MISOPROSTOL 200 MCG PO TABS
ORAL_TABLET | ORAL | Status: AC
  Filled 2020-10-14: qty 4

## 2020-10-14 MED ORDER — ACETAMINOPHEN 325 MG PO TABS: 650.0000 mg | ORAL_TABLET | ORAL | Status: DC | PRN

## 2020-10-14 MED ORDER — LIDOCAINE-EPINEPHRINE (PF) 1.5 %-1:200000 IJ SOLN
INTRAMUSCULAR | Status: DC | PRN
  Administered 2020-10-14: 3 mL via EPIDURAL

## 2020-10-14 MED ORDER — MISOPROSTOL 25 MCG QUARTER TABLET: 25.0000 ug | ORAL_TABLET | ORAL | Status: DC | PRN

## 2020-10-14 MED ORDER — OXYTOCIN BOLUS FROM INFUSION
333.0000 mL | Freq: Once | INTRAVENOUS | Status: AC
  Administered 2020-10-15: 333 mL via INTRAVENOUS

## 2020-10-14 MED ORDER — LIDOCAINE HCL (PF) 1 % IJ SOLN: 30.0000 mL | INTRAMUSCULAR | Status: AC | PRN

## 2020-10-14 MED ORDER — FENTANYL 2.5 MCG/ML W/ROPIVACAINE 0.15% IN NS 100 ML EPIDURAL (ARMC)
12.0000 mL/h | EPIDURAL | Status: DC
Start: 2020-10-14 — End: 2020-10-15
  Administered 2020-10-14 (×2): 12 mL/h via EPIDURAL
  Filled 2020-10-14: qty 100

## 2020-10-14 MED ORDER — LACTATED RINGERS IV SOLN: 500.0000 mL | INTRAVENOUS | Status: DC | PRN

## 2020-10-14 MED ORDER — OXYTOCIN-SODIUM CHLORIDE 30-0.9 UT/500ML-% IV SOLN
1.0000 m[IU]/min | INTRAVENOUS | Status: DC
  Administered 2020-10-14: 2 m[IU]/min via INTRAVENOUS
  Filled 2020-10-14: qty 1000

## 2020-10-14 MED ORDER — OXYTOCIN-SODIUM CHLORIDE 30-0.9 UT/500ML-% IV SOLN: 2.5000 [IU]/h | INTRAVENOUS | Status: DC

## 2020-10-14 MED ORDER — SODIUM CHLORIDE 0.9 % IV SOLN
INTRAVENOUS | Status: DC | PRN
  Administered 2020-10-14: 10 mL via EPIDURAL

## 2020-10-14 NOTE — Progress Notes (Signed)
Labor Progress Note  Marissa Kennedy is a 20 y.o. G1P0000 at [redacted]w[redacted]d by LMP admitted for elective induction of labor due to with favorable cervix at term.  Subjective: Patient on birthing ball. Tolerating contractions well, currently talking through them. Rating her discomfort with contractions at 5.5/10.   Objective: BP (!) 117/59 (BP Location: Left Arm)   Pulse 83   Temp 98 F (36.7 C) (Oral)   Resp 15   Ht 5\' 3"  (1.6 m)   Wt 91.4 kg   LMP 01/05/2020   BMI 35.71 kg/m  Notable VS details: wnl  Fetal Assessment: FHT:  FHR: 135 bpm, variability: moderate,  accelerations:  Present,  decelerations:  Absent Category/reactivity:  Category I UC:   irregular, every 2-4 minutes SVE:  Deferred at this time Membrane status:intact Amniotic color: n/a  Labs: Lab Results  Component Value Date   WBC 11.8 (H) 10/14/2020   HGB 12.9 10/14/2020   HCT 37.7 10/14/2020   MCV 89.5 10/14/2020   PLT 165 10/14/2020    Assessment / Plan: Elective induction of labor with favorable cervix at term,  progressing well on pitocin  Labor: Progressing on Pitocin, will continue to increase then AROM Preeclampsia:  No S&S of PIH Fetal Wellbeing:  Category I Pain Control:  Labor support without medications I/D:  GBS negative; Covid-19 negative Anticipated MOD:  NSVD  12/14/2020, CNM 10/14/2020, 12:19 PM

## 2020-10-14 NOTE — Anesthesia Preprocedure Evaluation (Signed)
Anesthesia Evaluation  Patient identified by MRN, date of birth, ID band Patient awake    Reviewed: Allergy & Precautions, NPO status , Patient's Chart, lab work & pertinent test results  History of Anesthesia Complications Negative for: history of anesthetic complications  Airway Mallampati: III  TM Distance: >3 FB Neck ROM: Full    Dental no notable dental hx. (+) Teeth Intact   Pulmonary neg pulmonary ROS, neg sleep apnea, neg COPD, Patient abstained from smoking.Not current smoker,    Pulmonary exam normal breath sounds clear to auscultation       Cardiovascular Exercise Tolerance: Good METS(-) hypertension(-) CAD and (-) Past MI negative cardio ROS  (-) dysrhythmias  Rhythm:Regular Rate:Normal - Systolic murmurs    Neuro/Psych negative neurological ROS  negative psych ROS   GI/Hepatic neg GERD  ,(+)     (-) substance abuse  ,   Endo/Other  neg diabetes  Renal/GU negative Renal ROS     Musculoskeletal   Abdominal   Peds  Hematology   Anesthesia Other Findings Past Medical History: 2017: Calculus of kidney     Comment:  DR. Zara Chess  Reproductive/Obstetrics (+) Pregnancy                             Anesthesia Physical Anesthesia Plan  ASA: II  Anesthesia Plan: Epidural   Post-op Pain Management:    Induction:   PONV Risk Score and Plan: 3 and Treatment may vary due to age or medical condition and Ondansetron  Airway Management Planned: Natural Airway  Additional Equipment:   Intra-op Plan:   Post-operative Plan:   Informed Consent: I have reviewed the patients History and Physical, chart, labs and discussed the procedure including the risks, benefits and alternatives for the proposed anesthesia with the patient or authorized representative who has indicated his/her understanding and acceptance.       Plan Discussed with: Surgeon  Anesthesia Plan Comments:  (Discussed R/B/A of neuraxial anesthesia technique with patient: - rare risks of spinal/epidural hematoma, nerve damage, infection - Risk of PDPH - Risk of itching - Risk of nausea and vomiting - Risk of poor block necessitating replacement of epidural. Patient voiced understanding.)        Anesthesia Quick Evaluation

## 2020-10-14 NOTE — H&P (Signed)
Obstetric H&P   Chief Complaint: Elective induction of labor with favorable cervix at term  Prenatal Care Provider: Consuella Lose  History of Present Illness: 20 y.o. G1P0000 [redacted]w[redacted]d by 10/21/2020, by 6 week Ultrasound presenting to L&D for elective induction of labor with favorable cervix at term. Her pregnancy course has been uncomplicated. She reports intermittent contractions overnight and +FM. She denies LOF or VB at this time.  Pregravid weight 72.6 kg Total Weight Gain 18.9 kg  Pregnancy#1 Problems (from 01/05/20 to present)    Problem Noted Resolved   Encounter for supervision of normal first pregnancy in first trimester 03/29/2020 by Tresea Mall, CNM No   Overview Addendum 10/06/2020  9:30 AM by Zipporah Plants, CNM    [facility]inic [facility] Prenatal Labs  Dating By [redacted]w[redacted]d u/s done @ [facility] Blood type: A+  Genetic Screen NIPS: normal XX Antibody: negative  Anatomic [place] Normal Rubella: Immune Varicella: Non-immune  GTT Early: NA               Third trimester: 08/04/20 RPR: Non Reactive (01/21 1058)   Rhogam Not needed HBsAg: Negative (09/15 1105)   Vaccines TDAP: completed in early pregnancy                      Flu Shot: Declines Covid: unvaccinated HIV: Non Reactive (01/21 1058)   Baby Food Breast- education done at [facility]                               HFW:YOVZCHYI/-- (03/18 1535)  GC/CT: negative  Contraception  pill Pap: first PAP 03/29/2020  CBB   Anatomy Scan: complete  CS/VBAC NA   Support Person Husband [name]          Previous Version       Review of Systems: 10 point review of systems negative unless otherwise noted in HPI  Past Medical History: Patient Active Problem List   Diagnosis Date Noted  . Pregnancy 10/14/2020  . Encounter for supervision of normal first pregnancy in first trimester 03/29/2020    [facility]inic [facility] Prenatal Labs  Dating By [redacted]w[redacted]d u/s done @ [facility] Blood type: A+  Genetic Screen NIPS: normal XX Antibody: negative   Anatomic [place] Normal Rubella: Immune Varicella: Non-immune  GTT Early: NA               Third trimester: 08/04/20 RPR: Non Reactive (01/21 1058)   Rhogam Not needed HBsAg: Negative (09/15 1105)   Vaccines TDAP: completed in early pregnancy                      Flu Shot: Declines Covid: unvaccinated HIV: Non Reactive (01/21 1058)   Baby Food Breast- education done at [facility]                               FOY:DXAJOINO/-- (03/18 1535)  GC/CT: negative  Contraception  pill Pap: first PAP 03/29/2020  CBB   Anatomy Scan: complete  CS/VBAC NA   Support Person Husband [name]         Past Surgical History: Past Surgical History:  Procedure Laterality Date  . NO PAST SURGERIES      Past Obstetric History: # 1 - Date: None, Sex: None, Weight: None, GA: None, Delivery: None, Apgar1: None, Apgar5: None, Living: None, Birth Comments: None   Past  Gynecologic History:  Family History: Family History  Problem Relation Age of Onset  . Diabetes Father        TYPE 2    Social History: Social History   Socioeconomic History  . Marital status: Married    Spouse name: [name]  . Number of children: 0  . Years of education: 30  . Highest education level: Not on file  Occupational History  . Not on file  Tobacco Use  . Smoking status: Never Smoker  . Smokeless tobacco: Never Used  Vaping Use  . Vaping Use: Never used  Substance and Sexual Activity  . Alcohol use: No  . Drug use: No  . Sexual activity: Yes    Birth control/protection: Pill  Other Topics Concern  . Not on file  Social History Narrative  . Not on file   Social Determinants of Health   Financial Resource Strain: Not on file  Food Insecurity: Not on file  Transportation Needs: Not on file  Physical Activity: Not on file  Stress: Not on file  Social Connections: Not on file  Intimate Partner Violence: Not on file    Medications: Prior to Admission medications   Medication Sig Start Date End Date Taking? Authorizing  Provider  Prenatal Vit-Fe Fumarate-FA (PNV PRENATAL PLUS MULTIVITAMIN) 27-1 MG TABS Take 1 tablet by mouth daily.   Yes [provider]    Allergies: No Known Allergies  Physical Exam: Vitals: Blood pressure (!) 117/59, pulse 83, temperature 98 F (36.7 C), temperature source Oral, resp. rate 15, height 5\' 3"  (1.6 m), weight 91.4 kg, last menstrual period 01/05/2020.  Fetal status: baseline - 140 bmp, moderate variability, +accels, currently no decels  Initially on admission, patient noted to have intermittent late decels, received a 500 ml IV bolus - tracing now Category I  Toco: 3-7 mins - palpate mild  General: NAD HEENT: normocephalic, anicteric Pulmonary: No increased work of breathing Cardiovascular: RRR, distal pulses 2+ Abdomen: Gravid, non-tender Leopolds: EFW 7.5 lb, vtx Genitourinary: 4/60/-2/anterior/medium to soft - Bishop score: 8 Extremities: no edema, erythema, or tenderness Neurologic: Grossly intact Psychiatric: mood appropriate, affect full  Labs: Results for orders placed or performed during the hospital encounter of 10/14/20 (from the past 24 hour(s))  CBC     Status: Abnormal   Collection Time: 10/14/20  8:53 AM  Result Value Ref Range   WBC 11.8 (H) 4.0 - 10.5 K/uL   RBC 4.21 3.87 - 5.11 MIL/uL   Hemoglobin 12.9 12.0 - 15.0 g/dL   HCT 12/14/20 14.9 - 70.2 %   MCV 89.5 80.0 - 100.0 fL   MCH 30.6 26.0 - 34.0 pg   MCHC 34.2 30.0 - 36.0 g/dL   RDW 63.7 85.8 - 85.0 %   Platelets 165 150 - 400 K/uL   nRBC 0.0 0.0 - 0.2 %    Assessment: 20 y.o. G1P0000 [redacted]w[redacted]d by 10/21/2020, by 6 week Ultrasound, presents for elective of induction of labor with favorable cervix at term.  Plan: 1) Patient will undergo induction of labor with pitocin.     Patient has been fully informed of the pros and cons, risks and benefits of continued observation with fetal monitoring versus that of induction of labor.   She understands that there are uncommon risks to induction,  which include but are not limited to : frequent or prolonged uterine contractions, fetal distress, uterine rupture, and lack of successful induction.  These risks include all methods including Pitocin and Misoprostol.  Patient understands  that using Misoprostol for labor induction is an "off label" indication although it has been studied extensively for this purpose and is an accepted method of induction.  She also has been informed of the increased risks for Cesarean with induction and should induction not be successful.  Patient consents to the induction plan of management.   2) Fetus - currently category I - will continue to monitor closely  3) PNL - Blood type A/Positive/-- (09/15 1105) / Anti-bodyscreen Negative (09/15 1105) / Rubella 1.93 (09/15 1105) / Varicella non-immune / RPR Non Reactive (01/21 1058) / HBsAg Negative (09/15 1105) / HIV Non Reactive (01/21 1058) / 1-hr OGTT 137 / GBS Negative/-- (03/18 1535)  4) Immunization History -  Immunization History  Administered Date(s) Administered  . Tdap 02/09/2020    5) Disposition - anticipate SVD  Zipporah Plants, CNM, MSN [facility], [facility] 10/14/2020, 9:45 AM

## 2020-10-14 NOTE — OB Triage Note (Signed)
Pt here for Scheduled IOL.

## 2020-10-14 NOTE — Anesthesia Procedure Notes (Signed)
Epidural Patient location during procedure: OB Start time: 10/14/2020 3:43 PM End time: 10/14/2020 4:02 PM  Staffing Anesthesiologist: Corinda Gubler, MD Performed: anesthesiologist   Preanesthetic Checklist Completed: patient identified, IV checked, site marked, risks and benefits discussed, surgical consent, monitors and equipment checked, pre-op evaluation and timeout performed  Epidural Patient position: sitting Prep: ChloraPrep Patient monitoring: heart rate, continuous pulse ox and blood pressure Approach: midline Location: L3-L4 Injection technique: LOR saline  Needle:  Needle type: Tuohy  Needle gauge: 17 G Needle length: 9 cm and 9 Needle insertion depth: 5 cm Catheter type: closed end flexible Catheter size: 19 Gauge Catheter at skin depth: 10 cm Test dose: negative and 1.5% lidocaine with Epi 1:200 K  Assessment Sensory level: T10 Events: blood not aspirated, injection not painful, no injection resistance, no paresthesia and negative IV test  Additional Notes first attempt Pt. Evaluated and documentation done after procedure finished. Patient identified. Risks/Benefits/Options discussed with patient including but not limited to bleeding, infection, nerve damage, paralysis, failed block, incomplete pain control, headache, blood pressure changes, nausea, vomiting, reactions to medication both or allergic, itching and postpartum back pain. Confirmed with bedside nurse the patient's most recent platelet count. Confirmed with patient that they are not currently taking any anticoagulation, have any bleeding history or any family history of bleeding disorders. Patient expressed understanding and wished to proceed. All questions were answered. Sterile technique was used throughout the entire procedure. Please see nursing notes for vital signs. Test dose was given through epidural catheter and negative prior to continuing to dose epidural or start infusion. Warning signs of high block  given to the patient including shortness of breath, tingling/numbness in hands, complete motor block, or any concerning symptoms with instructions to call for help. Patient was given instructions on fall risk and not to get out of bed. All questions and concerns addressed with instructions to call with any issues or inadequate analgesia.   Patient tolerated the insertion well without immediate complications.Reason for block:procedure for pain

## 2020-10-14 NOTE — Progress Notes (Signed)
Labor Progress Note  Marissa Kennedy is a 20 y.o. G1P0000 at [redacted]w[redacted]d by ultrasound admitted for elective induction of labor with favorable cervix at term.  Subjective: Patient is sitting in bed, working to breath through contractions. Now requesting medication for pain related to labor.  Objective: BP 105/63 (BP Location: Left Arm)   Pulse 83   Temp 98 F (36.7 C) (Oral)   Resp 16   Ht 5\' 3"  (1.6 m)   Wt 91.4 kg   LMP 01/05/2020   BMI 35.71 kg/m  Notable VS details: wnl  Fetal Assessment: FHT:  FHR: 145 bpm, variability: moderate (intermittent period of minimal variability noted),  accelerations:  Present,  decelerations: Present-intermittent variables noted Category/reactivity:  Category II UC:   regular, every 1.5-4 minutes SVE:   6/90/-2  Membrane status: BBOW Amniotic color: intact  Labs: Lab Results  Component Value Date   WBC 11.8 (H) 10/14/2020   HGB 12.9 10/14/2020   HCT 37.7 10/14/2020   MCV 89.5 10/14/2020   PLT 165 10/14/2020    Assessment / Plan: Elective induction of labor with favorable cervix at term, progressing well on pitocin  Labor: Progressing on Pitocin, will continue to increase then AROM Preeclampsia:  No S&S of PIH Fetal Wellbeing:  Category II - will continue to monitor closely Pain Control:  IV pain meds now, plan for epidural when patient requests I/D:  GBS negative; Covid-19 negative Anticipated MOD:  NSVD  12/14/2020, CNM 10/14/2020, 2:49 PM

## 2020-10-14 NOTE — Progress Notes (Signed)
Labor Progress Note  Marissa Kennedy is a 20 y.o. G1P0000 at [redacted]w[redacted]d by 6 week ultrasound admitted for elective induction with favorable cervix at term.  Subjective: Patient is resting comfortably in left lateral position with peanut ball placed. She denies any current sensation of contractions.   Objective: BP 105/63 (BP Location: Left Arm)   Pulse 83   Temp 98.5 F (36.9 C) (Oral)   Resp 16   Ht 5\' 3"  (1.6 m)   Wt 91.4 kg   LMP 01/05/2020   SpO2 99%   BMI 35.71 kg/m  Notable VS details: wnl  Fetal Assessment: FHT:  FHR: 130 bpm, variability: moderate,  accelerations:  Present,  decelerations:  Absent - currently absent - tracing previously Category II, now resolved to Category I with position changes - will continue to monitor closely Category/reactivity:  Category I UC:   regular, every 1.5-3 minutes SVE:  8-9/90/-1 Membrane status: AROM Amniotic color: clear  Labs: Lab Results  Component Value Date   WBC 11.8 (H) 10/14/2020   HGB 12.9 10/14/2020   HCT 37.7 10/14/2020   MCV 89.5 10/14/2020   PLT 165 10/14/2020    Assessment / Plan: Induction of labor at term with favorable cervix,  progressing well s/p AROM - pitocin currently held  Labor: Continue to monitor progress, making adequate change s/p AROM with pitocin held - will restart infusion as clinically indicated Preeclampsia:  No S&S of PIH Fetal Wellbeing:  Category I Pain Control:  Epidural I/D:  GBS negative; Covid-19 negative Anticipated MOD:  NSVD  12/14/2020, CNM 10/14/2020, 7:31 PM

## 2020-10-15 ENCOUNTER — Encounter: Payer: Self-pay | Admitting: Obstetrics & Gynecology

## 2020-10-15 DIAGNOSIS — Z3A39 39 weeks gestation of pregnancy: Secondary | ICD-10-CM

## 2020-10-15 LAB — CBC
HCT: 32.9 % — ABNORMAL LOW (ref 36.0–46.0)
Hemoglobin: 11.4 g/dL — ABNORMAL LOW (ref 12.0–15.0)
MCH: 31.1 pg (ref 26.0–34.0)
MCHC: 34.7 g/dL (ref 30.0–36.0)
MCV: 89.9 fL (ref 80.0–100.0)
Platelets: 164 10*3/uL (ref 150–400)
RBC: 3.66 MIL/uL — ABNORMAL LOW (ref 3.87–5.11)
RDW: 13.2 % (ref 11.5–15.5)
WBC: 21.2 10*3/uL — ABNORMAL HIGH (ref 4.0–10.5)
nRBC: 0 % (ref 0.0–0.2)

## 2020-10-15 LAB — RPR: RPR Ser Ql: NONREACTIVE

## 2020-10-15 MED ORDER — DIBUCAINE (PERIANAL) 1 % EX OINT: 1.0000 "application " | TOPICAL_OINTMENT | CUTANEOUS | Status: DC | PRN

## 2020-10-15 MED ORDER — OXYCODONE-ACETAMINOPHEN 5-325 MG PO TABS: 1.0000 | ORAL_TABLET | ORAL | Status: DC | PRN

## 2020-10-15 MED ORDER — IBUPROFEN 600 MG PO TABS
600.0000 mg | ORAL_TABLET | Freq: Four times a day (QID) | ORAL | Status: DC
  Administered 2020-10-15 – 2020-10-16 (×5): 600 mg via ORAL
  Filled 2020-10-15 (×6): qty 1

## 2020-10-15 MED ORDER — ZOLPIDEM TARTRATE 5 MG PO TABS: 5.0000 mg | ORAL_TABLET | Freq: Every evening | ORAL | Status: DC | PRN

## 2020-10-15 MED ORDER — BENZOCAINE-MENTHOL 20-0.5 % EX AERO
1.0000 "application " | INHALATION_SPRAY | CUTANEOUS | Status: DC | PRN
  Filled 2020-10-15 (×2): qty 56

## 2020-10-15 MED ORDER — SIMETHICONE 80 MG PO CHEW: 80.0000 mg | CHEWABLE_TABLET | ORAL | Status: DC | PRN

## 2020-10-15 MED ORDER — WITCH HAZEL-GLYCERIN EX PADS
1.0000 "application " | MEDICATED_PAD | CUTANEOUS | Status: DC | PRN
  Filled 2020-10-15: qty 100

## 2020-10-15 MED ORDER — DIPHENHYDRAMINE HCL 25 MG PO CAPS: 25.0000 mg | ORAL_CAPSULE | Freq: Four times a day (QID) | ORAL | Status: DC | PRN

## 2020-10-15 MED ORDER — SODIUM CHLORIDE 0.9 % IV SOLN: 250.0000 mL | INTRAVENOUS | Status: DC | PRN

## 2020-10-15 MED ORDER — WITCH HAZEL-GLYCERIN EX PADS
MEDICATED_PAD | CUTANEOUS | Status: AC
  Filled 2020-10-15: qty 100

## 2020-10-15 MED ORDER — ACETAMINOPHEN 325 MG PO TABS
650.0000 mg | ORAL_TABLET | ORAL | Status: DC | PRN
  Administered 2020-10-15 – 2020-10-16 (×4): 650 mg via ORAL
  Filled 2020-10-15 (×4): qty 2

## 2020-10-15 MED ORDER — SENNOSIDES-DOCUSATE SODIUM 8.6-50 MG PO TABS
2.0000 | ORAL_TABLET | ORAL | Status: DC
  Filled 2020-10-15: qty 2

## 2020-10-15 MED ORDER — SODIUM CHLORIDE 0.9% FLUSH: 3.0000 mL | INTRAVENOUS | Status: DC | PRN

## 2020-10-15 MED ORDER — OXYCODONE-ACETAMINOPHEN 5-325 MG PO TABS: 2.0000 | ORAL_TABLET | ORAL | Status: DC | PRN

## 2020-10-15 MED ORDER — COCONUT OIL OIL
1.0000 "application " | TOPICAL_OIL | Status: DC | PRN
  Administered 2020-10-15: 1 via TOPICAL
  Filled 2020-10-15: qty 120

## 2020-10-15 MED ORDER — VARICELLA VIRUS VACCINE LIVE 1350 PFU/0.5ML IJ SUSR
0.5000 mL | INTRAMUSCULAR | Status: DC | PRN
  Filled 2020-10-15: qty 0.5

## 2020-10-15 MED ORDER — SODIUM CHLORIDE 0.9% FLUSH: 3.0000 mL | Freq: Two times a day (BID) | INTRAVENOUS | Status: DC

## 2020-10-15 MED ORDER — ONDANSETRON HCL 4 MG/2ML IJ SOLN: 4.0000 mg | INTRAMUSCULAR | Status: DC | PRN

## 2020-10-15 MED ORDER — ONDANSETRON HCL 4 MG PO TABS: 4.0000 mg | ORAL_TABLET | ORAL | Status: DC | PRN

## 2020-10-15 NOTE — Progress Notes (Signed)
Obstetric Postpartum Daily Progress Note  Chief Complaint: Postpartum care after vaginal delivery  Subjective:  20 y.o. G1P1001 postpartum day #0 status post vaginal delivery.  She is ambulating, is tolerating po, is voiding spontaneously.  Her pain is well controlled on PO pain medications. Her lochia is less than menses. Patient is currently breastfeeding, states feed earlier this morning went well. Now with difficulty utilizing nipple shield.   Medications SCHEDULED MEDICATIONS  . ibuprofen  600 mg Oral Q6H  . senna-docusate  2 tablet Oral Q24H  . sodium chloride flush  3 mL Intravenous Q12H  . witch hazel-glycerin        MEDICATION INFUSIONS  . sodium chloride      PRN MEDICATIONS  sodium chloride flush **AND** sodium chloride flush **AND** sodium chloride, acetaminophen, benzocaine-Menthol, coconut oil, witch hazel-glycerin **AND** dibucaine, diphenhydrAMINE, ondansetron **OR** ondansetron (ZOFRAN) IV, oxyCODONE-acetaminophen, oxyCODONE-acetaminophen, simethicone, varicella virus vaccine live, zolpidem    Objective:   Vitals:   10/15/20 0523 10/15/20 0609 10/15/20 0650 10/15/20 0738  BP: (!) 102/50 97/67 107/69 (!) 93/54  Pulse: 82 88 84 82  Resp:  18 16 18   Temp:  97.6 F (36.4 C) 98.4 F (36.9 C) 98.4 F (36.9 C)  TempSrc:  Oral Oral Oral  SpO2:  99% 99% 100%  Weight:      Height:        Current Vital Signs 24h Vital Sign Ranges  T 98.4 F (36.9 C) Temp  Avg: 98.3 F (36.8 C)  Min: 97.6 F (36.4 C)  Max: 98.9 F (37.2 C)  BP (!) 93/54 BP  Min: 69/41  Max: 202/168  HR 82 Pulse  Avg: 79.5  Min: 63  Max: 93  RR 18 Resp  Avg: 17  Min: 16  Max: 18  SaO2 100 % Room Air SpO2  Avg: 98.1 %  Min: 96 %  Max: 100 %       24 Hour I/O Current Shift I/O  Time Ins Outs 04/02 0701 - 04/03 0700 In: 3924.8 [P.O.:500; I.V.:3290] Out: 1580 [Urine:1150] No intake/output data recorded.  General: NAD Pulmonary: no increased work of breathing Abdomen: non-distended, non-tender,  fundus firm at level of umbilicus Extremities: no edema, no erythema, no tenderness  Labs:  Recent Labs  Lab 10/14/20 0853 10/15/20 0655  WBC 11.8* 21.2*  HGB 12.9 11.4*  HCT 37.7 32.9*  PLT 165 164     Assessment:   19 y.o. G1P1001 postpartum day # 0 status post OVD - vacuum assisted, lactating  Plan:   1) Acute blood loss anemia - hemodynamically stable and asymptomatic  2) A POS Performed at [facility], [address] [place], [place]  / [address] 1.93 (09/15 1105)/ Varicella Not immune  3) TDAP status UTD - received in early antepartum - 02/09/20  4) breast /Contraception = oral progesterone-only contraceptive  5) Disposition - Continue current care, assess for discharge tomorrow  02/11/20, CNM 10/15/2020 9:59 AM

## 2020-10-15 NOTE — Plan of Care (Signed)
Transferred to Room 343. Alert and oriented with pleasant affect. Steady gait. Assessment and VS WNL. Oriented to Room and Education initiated.

## 2020-10-15 NOTE — Progress Notes (Addendum)
Called to beside per RN at 01:21 - patient had been pushing for one hour with RN. RN requested provider review strip. Patient had been experiencing late decelerations with minimal variability. CNM presented to bedside, on initial evaluation , accels noted on EFM. Late decelerations improved with position changes. Patient continued to push with strong effort. Frequent position changes were attempted with intermittent improvement in occurrence of late decelerations, EFM was persistently a Category II tracing. Minimal change noted in fetal station with caput palpated. Patient became increasingly exhausted during pushing efforts. At 2:30 AM, following two hours of maternal pushing efforts, Dr. Tiburcio Pea was notified via phone of fetal and maternal assessment and request was made for MD to evaluate patient in person to determine most appropriate mode of delivery. Dr. Tiburcio Pea to present to bedside.    Zipporah Plants, CNM 10/15/2020, 2:30 AM

## 2020-10-15 NOTE — Lactation Note (Signed)
This note was copied from a baby's chart. Lactation Consultation Note  Patient Name: Marissa Kennedy DTHYH'O Date: 10/15/2020 Reason for consult: Follow-up assessment Age:20  12:40 Initial Visit  Infant was swaddled, sleeping, and held by maternal father upon entry.    Patient had been provided a NS and was concerned about usage.  [facility] student explained the need for the shield due to patient having flat nipples. Both breast are flat, however, right breast everts a little with stimulation.    [facility] student taught hand expression, and educated on STS, supply and demand, feeding cues, infant belly size, breast changes, milk transfer, stool transition, and cluster feeding.   Contact lactation for next feeding.   3:20 Follow Up  Patient is attempting to latch infant on the left breast using NS.  [facility] student assist with the positioning and latching.  Infant fed on Left breast for 25 min.  Left feeding on right breast at end of consult.   [facility] student provided patient with a hand pump to assist with getting nipple more everted.  Patient does not desire to pump at this time and only want to use the pump to assist with everting the nipples.   Plan 1. Continue to feed infant 8+ times in 24 hr 2. Monitor infant for feeding cues 3. Use manual pump prior to latching to help bring nipple out 4. Compress and Massage breast during feedings       Maternal Data Has patient been taught Hand Expression?: Yes Does the patient have breastfeeding experience prior to this delivery?: No  Feeding Mother's Current Feeding Choice: Breast Milk  LATCH Score Latch: Repeated attempts needed to sustain latch, nipple held in mouth throughout feeding, stimulation needed to elicit sucking reflex.  Audible Swallowing: Spontaneous and intermittent  Type of Nipple: Flat  Comfort (Breast/Nipple): Soft / non-tender  Hold (Positioning): Assistance needed to correctly position infant at breast and maintain  latch.  LATCH Score: 7   Lactation Tools Discussed/Used Tools: Nipple Shields Nipple shield size: 24  Interventions Interventions: Assisted with latch;Breast massage;Hand express;Breast compression;Adjust position;Support pillows;Position options;Expressed milk;Education;Pre-pump if needed;Hand pump  Discharge WIC Program: No  Consult Status Consult Status: Follow-up Date: 10/16/20 Follow-up type: In-patient    Scarlette Ar 10/15/2020, 4:18 PM

## 2020-10-15 NOTE — Discharge Summary (Signed)
Postpartum Discharge Summary  Date of Service updated: 10/16/2020     Patient Name: Marissa Kennedy DOB: 12-22-2000 MRN: [id number]  Date of admission: 10/14/2020 Delivery date:10/15/2020  Delivering provider: Gae Dry  Date of discharge: 10/16/2020  Admitting diagnosis: Pregnancy [Z34.90] Intrauterine pregnancy: 109w1d    Secondary diagnosis:  Principal Problem:   [redacted] weeks gestation of pregnancy Active Problems:   Pregnancy   Vacuum-assisted vaginal delivery   Postpartum care following vaginal delivery   Third degree laceration of perineum during delivery, postpartum   Encounter for care or examination of lactating mother  Additional problems: none    Discharge diagnosis: Term Pregnancy Delivered                                              Post partum procedures: none Augmentation: AROM and Pitocin Complications: None  Hospital course: Induction of Labor With Vaginal Delivery   20y.o. yo G1P0000 at 313w1das admitted to the hospital 10/14/2020 for induction of labor.  Indication for induction: Favorable cervix at term.  Patient had an uncomplicated labor course as follows: Membrane Rupture Time/Date: 5:43 PM ,10/14/2020   Delivery Method:Vaginal, Vacuum (Extractor)  Episiotomy: Median  Lacerations:  3rd degree  Details of delivery can be found in separate delivery note.  Patient had a routine postpartum course. She is tolerating regular diet. Her pain is controlled with PO medications. She is ambulating and voiding without difficulty. She is having successful breastfeeding sessions.   Patient is discharged home 10/16/20.  Newborn Data: Birth date:10/15/2020  Birth time:3:03 AM  Gender:Female  [place] Living status:Living  Apgars:9 ,9  Weight:3520 g   Magnesium Sulfate received: No BMZ received: No Rhophylac:No MMR:No T-DaP:Given prenatally Flu: No Transfusion:No  Physical exam  Vitals:   10/15/20 1153 10/15/20 1602 10/15/20 2304 10/16/20 1003  BP: 105/64  92/61 110/70 120/70  Pulse: 87 69 77 98  Resp: _0 Temp: 98 F (36.7 C) 98.1 F (36.7 C) 97.8 F (36.6 C) 98.3 F (36.8 C)  TempSrc: Oral Oral Oral Oral  SpO2: 100% 99% 100% 100%  Weight:      Height:       General: alert, cooperative and no distress Lochia: appropriate Uterine Fundus: firm Incision: N/A DVT Evaluation: No evidence of DVT seen on physical exam. Labs: Lab Results  Component Value Date   WBC 21.2 (H) 10/15/2020   HGB 11.4 (L) 10/15/2020   HCT 32.9 (L) 10/15/2020   MCV 89.9 10/15/2020   PLT 164 10/15/2020   CMP Latest Ref Rng & Units 09/21/2020  Glucose 70 - 99 mg/dL 85  BUN 6 - 20 mg/dL 6  Creatinine 0.44 - 1.00 mg/dL 0.44  Sodium 135 - 145 mmol/L 135  Potassium 3.5 - 5.1 mmol/L 3.5  Chloride 98 - 111 mmol/L 106  CO2 22 - 32 mmol/L 20(L)  Calcium 8.9 - 10.3 mg/dL 8.8(L)  Total Protein 6.5 - 8.1 g/dL 6.7  Total Bilirubin 0.3 - 1.2 mg/dL 0.6  Alkaline Phos 38 - 126 U/L 89  AST 15 - 41 U/L 18  ALT 0 - 44 U/L 15   Edinburgh Score: Edinburgh Postnatal Depression Scale Screening Tool 10/15/2020  I have been able to laugh and see the funny side of things. 0  I have looked forward with enjoyment to things. 0  I have blamed  myself unnecessarily when things went wrong. 0  I have been anxious or worried for no good reason. 1  I have felt scared or panicky for no good reason. 1  Things have been getting on top of me. 1  I have been so unhappy that I have had difficulty sleeping. 0  I have felt sad or miserable. 0  I have been so unhappy that I have been crying. 0  The thought of harming myself has occurred to me. 0  Edinburgh Postnatal Depression Scale Total 3      After visit meds:  Allergies as of 10/16/2020   No Known Allergies     Medication List    TAKE these medications   PNV Prenatal Plus Multivitamin 27-1 MG Tabs Take 1 tablet by mouth daily.        Discharge home in stable condition Infant Feeding: Breast Infant  Disposition:home with mother Discharge instruction: per After Visit Summary and Postpartum booklet. Activity: Advance as tolerated. Pelvic rest for 6 weeks.  Diet: routine diet Anticipated Birth Control: POPs Postpartum Appointment:6 weeks Additional Postpartum F/U: PRN Future Appointments:No future appointments. Follow up Visit:  Follow-up Information    Gae Dry, MD. Schedule an appointment as soon as possible for a visit in 6 week(s).   Specialty: Obstetrics and Gynecology Contact information: [address] [place] [address] [phone]                 10/16/2020 Rod Can, CNM

## 2020-10-15 NOTE — Progress Notes (Addendum)
Labor Progress Note  Marissa Kennedy is a 20 y.o. G1P0000 at [redacted]w[redacted]d by 6 week ultrasound admitted for elective induction of labor with favorable cervix at term.  Subjective: Patient is resting comfortably in bed on right side with peanut ball in place.   Objective: BP 118/65   Pulse 93   Temp 98.9 F (37.2 C) (Oral)   Resp 16   Ht 5\' 3"  (1.6 m)   Wt 91.4 kg   LMP 01/05/2020   SpO2 98%   BMI 35.71 kg/m  Notable VS details: wnl  Fetal Assessment: FHT:  FHR: 125 bpm, variability: moderate,  accelerations:  Present,  decelerations:  Absent Category/reactivity:  Category I UC:   regular, every 1.5-4 minutes SVE:   9.5 (anterior lip)/100/-1 per nursing Membrane status:AROM Amniotic color: clear  Labs: Lab Results  Component Value Date   WBC 11.8 (H) 10/14/2020   HGB 12.9 10/14/2020   HCT 37.7 10/14/2020   MCV 89.5 10/14/2020   PLT 165 10/14/2020    Assessment / Plan: Induction of labor with favorable cervix at term, protracted active phase of labor - continue to titrate pitocin as tolerated  Labor: Continue to titrate pitocin as tolerated Preeclampsia:  No S&S of PIH Fetal Wellbeing:  Category I Pain Control:  Epidural I/D:  GBS negative; Covid-19 negative Anticipated MOD:  NSVD  12/14/2020, CNM 10/14/2020, 23:15 AM

## 2020-10-15 NOTE — Anesthesia Postprocedure Evaluation (Signed)
Anesthesia Post Note  Patient: Marissa Kennedy  Procedure(s) Performed: AN AD HOC LABOR EPIDURAL  Patient location during evaluation: Mother Baby Anesthesia Type: Epidural Level of consciousness: awake and alert and oriented Pain management: pain level controlled Vital Signs Assessment: post-procedure vital signs reviewed and stable Respiratory status: spontaneous breathing Cardiovascular status: blood pressure returned to baseline Postop Assessment: no headache and no backache Anesthetic complications: no   No complications documented.   Last Vitals:  Vitals:   10/15/20 0650 10/15/20 0738  BP: 107/69 (!) 93/54  Pulse: 84 82  Resp: 16 18  Temp: 36.9 C 36.9 C  SpO2: 99% 100%    Last Pain:  Vitals:   10/15/20 0738  TempSrc: Oral  PainSc:                  Dyllin Gulley

## 2020-10-15 NOTE — Discharge Instructions (Signed)
Discharge Instructions:   Follow-up Appointment: Call and schedule a follow-up appointment with Dr. Tiburcio Pea for a visit in 6 weeks!   If there are any new medications, they have been ordered and will be available for pickup at the listed pharmacy on your way home from the hospital.   Call office if you have any of the following: headache, visual changes, fever >101.0 F, chills, shortness of breath, breast concerns, excessive vaginal bleeding, incision drainage or problems, leg pain or redness, depression or any other concerns. If you have vaginal discharge with an odor, let your doctor know.   It is normal to bleed for up to 6 weeks. You should not soak through more than 1 pad in 1 hour. If you have a blood clot larger than your fist with continued bleeding, call your doctor.   Activity: Do not lift > 10 lbs for 6 weeks (do not lift anything heavier than your baby). No intercourse, tampons, swimming pools, hot tubs, baths (only showers) for 6 weeks.  No driving for 1-2 weeks. Continue prenatal vitamin, especially if breastfeeding. Increase calories and fluids (water) while breastfeeding.   Your milk will come in, in the next couple of days (right now it is colostrum). You may have a slight fever when your milk comes in, but it should go away on its own.  If it does not, and rises above 101 F please call the doctor. You will also feel achy and your breasts will be firm. They will also start to leak. If you are breastfeeding, continue as you have been and you can pump/express milk for comfort.   If you have too much milk, your breasts can become engorged, which could lead to mastitis. This is an infection of the milk ducts. It can be very painful and you will need to notify your doctor to obtain a prescription for antibiotics. You can also treat it with a shower or hot/cold compress.   For concerns about your baby, please call your pediatrician.  For breastfeeding concerns, the lactation consultant  can be reached at [phone].   Postpartum blues (feelings of happy one minute and sad another minute) are normal for the first few weeks but if it gets worse let your doctor know.   Congratulations! We enjoyed caring for you and your new bundle of joy!     Postpartum Care After Vaginal Delivery The following information offers guidance about how to care for yourself from the time you deliver your baby to 6-12 weeks after delivery (postpartum period). If you have problems or questions, contact your health care provider for more specific instructions. Follow these instructions at home: Vaginal bleeding  It is normal to have vaginal bleeding (lochia) after delivery. Wear a sanitary pad for bleeding and discharge. ? During the first week after delivery, the amount and appearance of lochia is often similar to a menstrual period. ? Over the next few weeks, it will gradually decrease to a dry, yellow-brown discharge. ? For most women, lochia stops completely by 4-6 weeks after delivery, but can vary.  Change your sanitary pads frequently. Watch for any changes in your flow, such as: ? A sudden increase in volume. ? A change in color. ? Large blood clots.  If you pass a blood clot from your vagina, save it and call your health care provider. Do not flush blood clots down the toilet before talking with your health care provider.  Do not use tampons or douches until your health care provider  approves.  If you are not breastfeeding, your period should return 6-8 weeks after delivery. If you are feeding your baby breast milk only, your period may not return until you stop breastfeeding. Perineal care  Keep the area between the vagina and the anus (perineum) clean and dry. Use medicated pads and pain-relieving sprays and creams as directed.  If you had a surgical cut in the perineum (episiotomy) or a tear, check the area for signs of infection until you are healed. Check for: ? More redness,  swelling, or pain. ? Fluid or blood coming from the cut or tear. ? Warmth. ? Pus or a bad smell.  You may be given a squirt bottle to use instead of wiping to clean the perineum area after you use the bathroom. Pat the area gently to dry it.  To relieve pain caused by an episiotomy, a tear, or swollen veins in the anus (hemorrhoids), take a warm sitz bath 2-3 times a day. In a sitz bath, the warm water should only come up to your hips and cover your buttocks.   Breast care  In the first few days after delivery, your breasts may feel heavy, full, and uncomfortable (breast engorgement). Milk may also leak from your breasts. Ask your health care provider about ways to help relieve the discomfort.  If you are breastfeeding: ? Wear a bra that supports your breasts and fits well. Use breast pads to absorb milk that leaks. ? Keep your nipples clean and dry. Apply creams and ointments as told. ? You may have uterine contractions every time you breastfeed for up to several weeks after delivery. This helps your uterus return to its normal size. ? If you have any problems with breastfeeding, notify your health care provider or lactation consultant.  If you are not breastfeeding: ? Avoid touching your breasts. Do not squeeze out (express) milk. Doing this can make your breasts produce more milk. ? Wear a good-fitting bra and use cold packs to help with swelling. Intimacy and sexuality  Ask your health care provider when you can engage in sexual activity. This may depend upon: ? Your risk of infection. ? How fast you are healing. ? Your comfort and desire to engage in sexual activity.  You are able to get pregnant after delivery, even if you have not had your period. Talk with your health care provider about methods of birth control (contraception) or family planning if you desire future pregnancies. Medicines  Take over-the-counter and prescription medicines only as told by your health care  provider.  Take an over-the-counter stool softener to help ease bowel movements as told by your health care provider.  If you were prescribed an antibiotic medicine, take it as told by your health care provider. Do not stop taking the antibiotic even if you start to feel better.  Review all previous and current prescriptions to check for possible transfer into breast milk. Activity  Gradually return to your normal activities as told by your health care provider.  Rest as much as possible. Nap while your baby is sleeping. Eating and drinking  Drink enough fluid to keep your urine pale yellow.  To help prevent or relieve constipation, eat high-fiber foods every day.  Choose healthy eating to support breastfeeding or weight loss goals.  Take your prenatal vitamins until your health care provider tells you to stop.   General tips/recommendations  Do not use any products that contain nicotine or tobacco. These products include cigarettes, chewing tobacco, and  vaping devices, such as e-cigarettes. If you need help quitting, ask your health care provider.  Do not drink alcohol, especially if you are breastfeeding.  Do not take medications or drugs that are not prescribed to you, especially if you are breastfeeding.  Visit your health care provider for a postpartum checkup within the first 3-6 weeks after delivery.  Complete a comprehensive postpartum visit no later than 12 weeks after delivery.  Keep all follow-up visits for you and your baby. Contact a health care provider if:  You feel unusually sad or worried.  Your breasts become red, painful, or hard.  You have a fever or other signs of an infection.  You have bleeding that is soaking through one pad an hour or you have blood clots.  You have a severe headache that doesn't go away or you have vision changes.  You have nausea and vomiting and are unable to eat or drink anything for 24 hours. Get help right away if:  You  have chest pain or difficulty breathing.  You have sudden, severe leg pain.  You faint or have a seizure.  You have thoughts about hurting yourself or your baby. If you ever feel like you may hurt yourself or others, or have thoughts about taking your own life, get help right away. Go to your nearest emergency department or:  Call your local emergency services (911 in the [place]  The National Suicide Prevention Lifeline at 562-219-9949. This suicide crisis helpline is open 24 hours a day.  Text the Crisis Text Line at 8047051922 (in the [place] Summary  The period of time after you deliver your newborn up to 6-12 weeks after delivery is called the postpartum period.  Keep all follow-up visits for you and your baby.  Review all previous and current prescriptions to check for possible transfer into breast milk.  Contact a health care provider if you feel unusually sad or worried during the postpartum period. This information is not intended to replace advice given to you by your health care provider. Make sure you discuss any questions you have with your health care provider. Document Revised: 03/16/2020 Document Reviewed: 03/16/2020 Elsevier Patient Education  2021 [employer].

## 2020-10-15 NOTE — Plan of Care (Signed)
  Problem: Education: Goal: Knowledge of General Education information will improve Description: Including pain rating scale, medication(s)/side effects and non-pharmacologic comfort measures Outcome: Progressing   Problem: Clinical Measurements: Goal: Ability to maintain clinical measurements within normal limits will improve Outcome: Progressing   Problem: Clinical Measurements: Goal: Will remain free from infection Outcome: Progressing   Problem: Nutrition: Goal: Adequate nutrition will be maintained Outcome: Progressing   Problem: Elimination: Goal: Will not experience complications related to bowel motility Outcome: Progressing   

## 2020-10-16 NOTE — Progress Notes (Signed)
Patient discharged home with infant. FOB present at discharge. Discharge instructions and prescriptions given and reviewed with patient. Patient verbalized understanding.   Encouraged patient to call and schedule follow-up appointment for a visit in 6 weeks with Dr. Tiburcio Pea.   Varicella vaccine declined.    Escorted out by volunteers.

## 2020-10-16 NOTE — Lactation Note (Signed)
This note was copied from a baby's chart. Lactation Consultation Note  Patient Name: Marissa Kennedy [id number] Date: 10/16/2020 Reason for consult: Follow-up assessment;Mother's request;Term Age:21 hours  Lactation follow-up before anticipated discharge.  Mom has been feeding baby with cues, using nipple shield due to short/flat/inverted nipples.   LC in room to assist with final feed before discharge. Baby skin to skin with mom. LC assisted with pillow placement for football hold, placement of nipple shield, and latching of baby. Baby grasped the breast easily, and had a strong rhythmic sucking pattern, after a few minutes audible swallows heard and pointed out to mom.  While baby was feeding LC reviewed what to expect with breastfeeding and breast changes in the days to come. Encouraged 8 or more feedings within 24 hours, reviewed early cues, cluster feeding, growth spurts, and output expectations.  Discussed implementation of pumping/bottles per mom's questions. Encouraged at breast feedings and milk establishment for first 3-4 weeks, use of hand pump or haaka for occasional discomfort relief if needed.  Provided information for outpatient lactation services and community breastfeeding support.   Maternal Data Has patient been taught Hand Expression?: Yes Does the patient have breastfeeding experience prior to this delivery?: No  Feeding Mother's Current Feeding Choice: Breast Milk  LATCH Score Latch: Grasps breast easily, tongue down, lips flanged, rhythmical sucking. (w/ shield)  Audible Swallowing: Spontaneous and intermittent  Type of Nipple: Flat  Comfort (Breast/Nipple): Filling, red/small blisters or bruises, mild/mod discomfort (mom has slight sensitivity)  Hold (Positioning): Assistance needed to correctly position infant at breast and maintain latch.  LATCH Score: 7   Lactation Tools Discussed/Used Tools: Nipple Shields Nipple shield size:  20  Interventions Interventions: Breast feeding basics reviewed;Assisted with latch;Hand express;Adjust position;Support pillows;Position options;Education  Discharge Discharge Education: Engorgement and breast care;Warning signs for feeding baby;Outpatient recommendation Pump: DEBP;Manual;Personal  Consult Status Consult Status: Complete Date: 10/16/20 Follow-up type: Call as needed    Danford Bad 10/16/2020, 9:39 AM

## 2020-10-18 ENCOUNTER — Telehealth: Payer: Self-pay

## 2020-10-18 NOTE — Telephone Encounter (Signed)
Marissa Kennedy from [facility] calling; please fax rx to [phone].    [phone]

## 2020-10-18 NOTE — Telephone Encounter (Signed)
Spoke c [place] who will have another rx for breast pump faxed to [place].

## 2020-10-18 NOTE — Telephone Encounter (Signed)
Can you have them resend the fax, I faxed it last week.I dont have it anymore

## 2020-10-21 ENCOUNTER — Inpatient Hospital Stay: Admit: 2020-10-21 | Payer: PRIVATE HEALTH INSURANCE

## 2020-10-25 ENCOUNTER — Other Ambulatory Visit: Payer: Self-pay | Admitting: Obstetrics

## 2020-10-25 DIAGNOSIS — O9122 Nonpurulent mastitis associated with the puerperium: Secondary | ICD-10-CM

## 2020-10-25 MED ORDER — DICLOXACILLIN SODIUM 500 MG PO CAPS: 500.0000 mg | ORAL_CAPSULE | Freq: Four times a day (QID) | ORAL | 0 refills | Status: AC

## 2020-10-25 NOTE — Telephone Encounter (Signed)
Contacted patient to scheduled appointment with Jae Dire for 11/01/20. Patient reported poss mastitis symptoms. I had Paula Compton contact patient.

## 2020-10-25 NOTE — Progress Notes (Signed)
Patient who is 10 days post delivery, called the office reporting Lactation challenges , and the development of flu like symptoms. She is being worked in Advertising account executive with a provider. I have called and spoken to her. She does not have a fever, but feels over full and is not sure that her breast pump is emptying her breasts.  I worked with her by phone for 20 minutes, and suggested, based on our discussion, that she contact the Lact[facility] at [facility] for an appointment. She would like to nurse directly, but has not been able to achieve a latch with her baby,with or without nipple shields ( flat nipples). I have encouraged her to rest at home, empty her breasts or nurse often, and take Tylenol for pain and any fever. A prescription for dicloxacillinhas been sent to the pharmacy for her to pick up.   I believe that additional Lactation Education and support would be extremely beneficial for her.  Mirna Mires, CNM  10/25/2020 4:20 PM

## 2020-10-26 ENCOUNTER — Ambulatory Visit (INDEPENDENT_AMBULATORY_CARE_PROVIDER_SITE_OTHER): Payer: No Typology Code available for payment source | Admitting: Obstetrics and Gynecology

## 2020-10-26 ENCOUNTER — Encounter: Payer: Self-pay | Admitting: Obstetrics and Gynecology

## 2020-10-26 ENCOUNTER — Other Ambulatory Visit: Payer: Self-pay

## 2020-10-26 VITALS — BP 100/60 | Ht 63.0 in | Wt 177.0 lb

## 2020-10-26 DIAGNOSIS — O9123 Nonpurulent mastitis associated with lactation: Secondary | ICD-10-CM

## 2020-10-26 MED ORDER — CEPHALEXIN 500 MG PO TABS: 500.0000 mg | ORAL_TABLET | Freq: Four times a day (QID) | ORAL | 0 refills | Status: AC

## 2020-10-26 NOTE — Progress Notes (Signed)
Obstetrics & Gynecology Office Visit   Chief Complaint  Patient presents with  . Breast Exam    Breast pain, swelling on both since last weekend   History of Present Illness: 20 y.o. G15P1001 female who is 11 days postpartum from a vaginal delivery.  Delivery complicated by need for vacuum assisted delivery and 3rd degree laceration (unspecified).  Since this past weekend the patient has been experiencing breast pain and tenderness.  She has noticed some areas of redness, but these are mild.  She has felt feverish.  However, she has not measured an actual fever when she takes her temperature.  She is pumping regularly as she is having difficulty with latching.  She was given a prescription for dicloxacillin yesterday which she has been unable to fill due to cost.  She states that she is able to pick it up today and the cost is about $60.  She denies any other acute complaints.    Past Medical History:  Diagnosis Date  . Calculus of kidney 2017   DR. Zara Chess    Past Surgical History:  Procedure Laterality Date  . NO PAST SURGERIES      Gynecologic History: No LMP recorded.  Obstetric History: G1P1001  Family History  Problem Relation Age of Onset  . Diabetes Father        TYPE 2    Social History   Socioeconomic History  . Marital status: Married    Spouse name: [name]  . Number of children: 0  . Years of education: 32  . Highest education level: Not on file  Occupational History  . Not on file  Tobacco Use  . Smoking status: Never Smoker  . Smokeless tobacco: Never Used  Vaping Use  . Vaping Use: Never used  Substance and Sexual Activity  . Alcohol use: No  . Drug use: No  . Sexual activity: Not Currently    Birth control/protection: None  Other Topics Concern  . Not on file  Social History Narrative  . Not on file   Social Determinants of Health   Financial Resource Strain: Not on file  Food Insecurity: Not on file  Transportation Needs: Not on file   Physical Activity: Not on file  Stress: Not on file  Social Connections: Not on file  Intimate Partner Violence: Not on file    No Known Allergies  Prior to Admission medications   Medication Sig Start Date End Date Taking? Authorizing Provider  Prenatal Vit-Fe Fumarate-FA (PNV PRENATAL PLUS MULTIVITAMIN) 27-1 MG TABS Take 1 tablet by mouth daily.   Yes [provider]  dicloxacillin (DYNAPEN) 500 MG capsule Take 1 capsule (500 mg total) by mouth 4 (four) times daily for 10 days. Patient not taking: Reported on 10/26/2020 10/25/20 11/04/20  Mirna Mires, CNM    Review of Systems  Constitutional: Negative.   HENT: Negative.   Eyes: Negative.   Respiratory: Negative.   Cardiovascular: Negative.   Gastrointestinal: Negative.   Genitourinary: Negative.   Musculoskeletal: Negative.   Skin: Negative.        See hpi  Neurological: Negative.   Psychiatric/Behavioral: Negative.      Physical Exam BP 100/60   Ht 5\' 3"  (1.6 m)   Wt 177 lb (80.3 kg)   Breastfeeding Yes   BMI 31.35 kg/m  No LMP recorded. Physical Exam Constitutional:      General: She is not in acute distress.    Appearance: Normal appearance.  Genitourinary:  Breasts:  Tanner Score is 5.     Right: Swelling, nipple discharge (milk) and tenderness present. No bleeding, inverted nipple, mass, skin change, axillary adenopathy or supraclavicular adenopathy.     Left: Swelling, nipple discharge (milk) and tenderness present. No bleeding, inverted nipple, mass, skin change, axillary adenopathy or supraclavicular adenopathy.    HENT:     Head: Normocephalic and atraumatic.  Eyes:     General: No scleral icterus.    Conjunctiva/sclera: Conjunctivae normal.  Lymphadenopathy:     Upper Body:     Right upper body: No supraclavicular or axillary adenopathy.     Left upper body: No supraclavicular or axillary adenopathy.  Neurological:     General: No focal deficit present.     Mental Status: She is  alert and oriented to person, place, and time.     Cranial Nerves: No cranial nerve deficit.  Psychiatric:        Mood and Affect: Mood normal.        Behavior: Behavior normal.        Judgment: Judgment normal.     Female chaperone present for pelvic and breast  portions of the physical exam  Assessment: 20 y.o. G70P1001 female here for  1. Nonpurulent mastitis associated with lactation      Plan: Problem List Items Addressed This Visit   None   Visit Diagnoses    Nonpurulent mastitis associated with lactation    -  Primary   Relevant Medications   Cephalexin 500 MG tablet     She appears to have a mild and likely early form of lactational mastitis.  Her reported history is more consistent then exam findings.  However, there are some supportive findings to suggest an early mastitis.  This appears to be bacterial in nature and her risk of MRSA is low.  We will call in Keflex, as this may be a cheaper alternative than dicloxacillin.  Precautions given for worsening symptoms and nonresponse to the treatment.  A total of 21 minutes were spent face-to-face with the patient as well as preparation, review, communication, and documentation during this encounter.    Thomasene Mohair, MD 10/26/2020 9:27 AM

## 2020-11-01 ENCOUNTER — Ambulatory Visit: Payer: No Typology Code available for payment source | Admitting: Obstetrics and Gynecology

## 2020-11-30 ENCOUNTER — Ambulatory Visit (INDEPENDENT_AMBULATORY_CARE_PROVIDER_SITE_OTHER): Payer: No Typology Code available for payment source | Admitting: Obstetrics & Gynecology

## 2020-11-30 ENCOUNTER — Encounter: Payer: Self-pay | Admitting: Obstetrics & Gynecology

## 2020-11-30 ENCOUNTER — Other Ambulatory Visit: Payer: Self-pay

## 2020-11-30 MED ORDER — NORETHINDRONE 0.35 MG PO TABS: 1.0000 | ORAL_TABLET | Freq: Every day | ORAL | 3 refills | Status: DC

## 2020-11-30 NOTE — Progress Notes (Signed)
  OBSTETRICS POSTPARTUM CLINIC PROGRESS NOTE  Subjective:     Marissa Kennedy is a 20 y.o. G12P1001 female who presents for a postpartum visit. She is 6 weeks postpartum following a Term pregnancy or Uncomplicated pregnancy and delivery by VAVD.  I have fully reviewed the prenatal and intrapartum course. Anesthesia: epidural.  Postpartum course has been complicated by uncomplicated.  Baby is feeding by Breast.  Bleeding: patient has not  resumed menses.  Bowel function is normal. Bladder function is normal.  Patient is not sexually active. Contraception method desired is oral progesterone-only contraceptive.  Postpartum depression screening: negative. Edinburgh 4.  The following portions of the patient's history were reviewed and updated as appropriate: allergies, current medications, past family history, past medical history, past social history, past surgical history and problem list.  Review of Systems Pertinent items are noted in HPI.  Objective:    BP 120/80   Ht 5\' 3"  (1.6 m)   Wt 171 lb (77.6 kg)   BMI 30.29 kg/m   General:  alert and no distress   Breasts:  inspection negative, no nipple discharge or bleeding, no masses or nodularity palpable  Lungs: clear to auscultation bilaterally  Heart:  regular rate and rhythm, S1, S2 normal, no murmur, click, rub or gallop  Abdomen: soft, non-tender; bowel sounds normal; no masses,  no organomegaly.     Vulva:  normal  Vagina: normal vagina, no discharge, exudate, lesion, or erythema  Cervix:  no cervical motion tenderness and no lesions  Corpus: normal size, contour, position, consistency, mobility, non-tender  Adnexa:  normal adnexa and no mass, fullness, tenderness  Rectal Exam: Not performed.          Assessment:  Post Partum Care visit 1. Postpartum care and examination  Plan:  See orders and Patient Instructions Contraceptive counseling for oral progesterone-only contraceptive Resume all normal activities Follow up  in: 4 months or as needed. (PAP)  , MD, Annamarie Major Ob/Gyn, [facility] 11/30/2020  11:25 AM

## 2021-04-03 ENCOUNTER — Other Ambulatory Visit (HOSPITAL_COMMUNITY)
Admission: RE | Admit: 2021-04-03 | Discharge: 2021-04-03 | Disposition: A | Discharge status: Home / Self Care | Payer: No Typology Code available for payment source | Source: Ambulatory Visit | Attending: Obstetrics & Gynecology | Admitting: Obstetrics & Gynecology

## 2021-04-03 ENCOUNTER — Other Ambulatory Visit: Payer: Self-pay

## 2021-04-03 ENCOUNTER — Ambulatory Visit (INDEPENDENT_AMBULATORY_CARE_PROVIDER_SITE_OTHER): Payer: No Typology Code available for payment source | Admitting: Obstetrics & Gynecology

## 2021-04-03 ENCOUNTER — Encounter: Payer: Self-pay | Admitting: Obstetrics & Gynecology

## 2021-04-03 VITALS — BP 120/80 | Ht 63.0 in | Wt 149.0 lb

## 2021-04-03 DIAGNOSIS — Z124 Encounter for screening for malignant neoplasm of cervix: Secondary | ICD-10-CM

## 2021-04-03 DIAGNOSIS — Z01419 Encounter for gynecological examination (general) (routine) without abnormal findings: Secondary | ICD-10-CM | POA: Diagnosis not present

## 2021-04-03 MED ORDER — JUNEL FE 24 1-20 MG-MCG(24) PO TABS: 1.0000 | ORAL_TABLET | Freq: Every day | ORAL | 3 refills | Status: DC

## 2021-04-03 NOTE — Patient Instructions (Signed)
Thank you for choosing [facility]. As part of our ongoing efforts to improve patient experience, we would appreciate your feedback. Please fill out the short survey that you will receive by mail or MyChart. Your opinion is important to [place]! -Dr Tiburcio Pea  PAP yearly

## 2021-04-03 NOTE — Progress Notes (Signed)
HPI:      [name] is a 20 y.o. G1P1001 who LMP was No LMP recorded (lmp unknown)., she presents today for her annual examination. The patient has no complaints today. The patient is sexually active. Her last pap: was normal. The patient does perform self breast exams.  There is no notable family history of breast or ovarian cancer in her family.  The patient has regular exercise: yes.  The patient denies current symptoms of depression.    GYN History: Contraception: oral progesterone-only contraceptive  PMHx: Past Medical History:  Diagnosis Date   Calculus of kidney 2017   DR. LARRY SYKES   Past Surgical History:  Procedure Laterality Date   NO PAST SURGERIES     Family History  Problem Relation Age of Onset   Diabetes Father        TYPE 2   Social History   Tobacco Use   Smoking status: Never   Smokeless tobacco: Never  Vaping Use   Vaping Use: Never used  Substance Use Topics   Alcohol use: No   Drug use: No    Current Outpatient Medications:    Norethindrone Acetate-Ethinyl Estrad-FE (JUNEL FE 24) 1-20 MG-MCG(24) tablet, Take 1 tablet by mouth daily., Disp: 84 tablet, Rfl: 3 Allergies: Patient has no known allergies.  Review of Systems  Constitutional:  Negative for chills, fever and malaise/fatigue.  HENT:  Negative for congestion, sinus pain and sore throat.   Eyes:  Negative for blurred vision and pain.  Respiratory:  Negative for cough and wheezing.   Cardiovascular:  Negative for chest pain and leg swelling.  Gastrointestinal:  Negative for abdominal pain, constipation, diarrhea, heartburn, nausea and vomiting.  Genitourinary:  Negative for dysuria, frequency, hematuria and urgency.  Musculoskeletal:  Negative for back pain, joint pain, myalgias and neck pain.  Skin:  Negative for itching and rash.  Neurological:  Negative for dizziness, tremors and weakness.  Endo/Heme/Allergies:  Does not bruise/bleed easily.  Psychiatric/Behavioral:   Negative for depression. The patient is not nervous/anxious and does not have insomnia.    Objective: BP 120/80   Ht 5\' 3"  (1.6 m)   Wt 149 lb (67.6 kg)   LMP  (LMP Unknown)   Breastfeeding Yes   BMI 26.39 kg/m   Filed Weights   04/03/21 0854  Weight: 149 lb (67.6 kg)   Body mass index is 26.39 kg/m. Physical Exam Constitutional:      General: She is not in acute distress.    Appearance: She is well-developed.  Genitourinary:     Bladder, rectum and urethral meatus normal.     No lesions in the vagina.     Right Labia: No rash, tenderness or lesions.    Left Labia: No tenderness, lesions or rash.    No vaginal bleeding.      Right Adnexa: not tender and no mass present.    Left Adnexa: not tender and no mass present.    No cervical motion tenderness, friability, lesion or polyp.     Uterus is not enlarged.     No uterine mass detected.    Pelvic exam was performed with patient in the lithotomy position.  Breasts:    Right: No mass, skin change or tenderness.     Left: No mass, skin change or tenderness.  HENT:     Head: Normocephalic and atraumatic. No laceration.     Right Ear: Hearing normal.     Left Ear: Hearing normal.  Mouth/Throat:     Pharynx: Uvula midline.  Eyes:     Pupils: Pupils are equal, round, and reactive to light.  Neck:     Thyroid: No thyromegaly.  Cardiovascular:     Rate and Rhythm: Normal rate and regular rhythm.     Heart sounds: No murmur heard.   No friction rub. No gallop.  Pulmonary:     Effort: Pulmonary effort is normal. No respiratory distress.     Breath sounds: Normal breath sounds. No wheezing.  Abdominal:     General: Bowel sounds are normal. There is no distension.     Palpations: Abdomen is soft.     Tenderness: There is no abdominal tenderness. There is no rebound.  Musculoskeletal:        General: Normal range of motion.     Cervical back: Normal range of motion and neck supple.  Neurological:     Mental Status: She  is alert and oriented to person, place, and time.     Cranial Nerves: No cranial nerve deficit.  Skin:    General: Skin is warm and dry.  Psychiatric:        Judgment: Judgment normal.  Vitals reviewed.    Assessment:  ANNUAL EXAM 1. Women's annual routine gynecological examination   2. Screening for cervical cancer      Screening Plan:            1.  Cervical Screening-  Pap smear done today  2.  Labs managed by PCP  5. Counseling for contraception: oral contraceptives (estrogen/progesterone) Desires to stop breast pumping/feeding now Will transition form POP to reg OCPs All alternatives discussed as well   Upstream - 04/03/21 0856       Pregnancy Intention Screening   Does the patient want to become pregnant in the next year? No    Does the patient's partner want to become pregnant in the next year? No    Would the patient like to discuss contraceptive options today? No      Contraception Wrap Up   Current Method Oral Contraceptive    End Method Oral Contraceptive    Contraception Counseling Provided Yes            The pregnancy intention screening data noted above was reviewed. Potential methods of contraception were discussed. The patient elected to proceed with Oral Contraceptive.     F/U  Return in about 1 year (around 04/03/2022) for Annual.  Annamarie Major, MD, [facility], [facility] 04/03/2021  9:30 AM

## 2021-04-04 LAB — CYTOLOGY - PAP
Chlamydia: NEGATIVE
Comment: NEGATIVE
Comment: NEGATIVE
Comment: NORMAL
Diagnosis: NEGATIVE
Neisseria Gonorrhea: NEGATIVE
Trichomonas: NEGATIVE

## 2021-08-07 ENCOUNTER — Other Ambulatory Visit: Payer: Self-pay

## 2021-08-07 ENCOUNTER — Encounter: Payer: Self-pay | Admitting: Advanced Practice Midwife

## 2021-08-07 ENCOUNTER — Ambulatory Visit (INDEPENDENT_AMBULATORY_CARE_PROVIDER_SITE_OTHER): Payer: No Typology Code available for payment source | Admitting: Advanced Practice Midwife

## 2021-08-07 VITALS — BP 120/80 | Ht 63.0 in | Wt 161.0 lb

## 2021-08-07 DIAGNOSIS — Z30016 Encounter for initial prescription of transdermal patch hormonal contraceptive device: Secondary | ICD-10-CM

## 2021-08-07 MED ORDER — NORELGESTROMIN-ETH ESTRADIOL 150-35 MCG/24HR TD PTWK: 1.0000 | MEDICATED_PATCH | TRANSDERMAL | 5 refills | Status: DC

## 2021-08-07 NOTE — Progress Notes (Signed)
° °  Patient ID: Marissa Kennedy, female   DOB: 01-25-2001, 21 y.o.   MRN: [id number]  Reason for Consult: Contraception   Subjective:  HPI:  Marissa Kennedy is a 21 y.o. female being seen for birth control consultation. She has been on OCP on and off for the past 7 years. She has recently noticed an increase in depression and anxiety. She reports the symptoms as mild and she is still able to cope with day to day responsibilities. She would like to change from OCP to the patch to see if it makes a difference in mood and also so she doesn't have to take something daily. We reviewed all birth control options and she declines anything inserted.  Past Medical History:  Diagnosis Date   Calculus of kidney 2017   DR. LARRY SYKES   Family History  Problem Relation Age of Onset   Diabetes Father        TYPE 2   Past Surgical History:  Procedure Laterality Date   NO PAST SURGERIES      Short Social History:  Social History   Tobacco Use   Smoking status: Never   Smokeless tobacco: Never  Substance Use Topics   Alcohol use: No    No Known Allergies  Current Outpatient Medications  Medication Sig Dispense Refill   norelgestromin-ethinyl estradiol Marilu Favre) 150-35 MCG/24HR transdermal patch Place 1 patch onto the skin once a week. 9 patch 5   No current facility-administered medications for this visit.    Review of Systems  Constitutional:  Negative for chills and fever.  HENT:  Negative for congestion, ear discharge, ear pain, hearing loss, sinus pain and sore throat.   Eyes:  Negative for blurred vision and double vision.  Respiratory:  Negative for cough, shortness of breath and wheezing.   Cardiovascular:  Negative for chest pain, palpitations and leg swelling.  Gastrointestinal:  Negative for abdominal pain, blood in stool, constipation, diarrhea, heartburn, melena, nausea and vomiting.  Genitourinary:  Negative for dysuria, flank pain, frequency, hematuria and urgency.   Musculoskeletal:  Negative for back pain, joint pain and myalgias.  Skin:  Negative for itching and rash.  Neurological:  Negative for dizziness, tingling, tremors, sensory change, speech change, focal weakness, seizures, loss of consciousness, weakness and headaches.  Endo/Heme/Allergies:  Negative for environmental allergies. Does not bruise/bleed easily.  Psychiatric/Behavioral:  Negative for depression, hallucinations, memory loss, substance abuse and suicidal ideas. The patient is not nervous/anxious and does not have insomnia.        Positive for anxiety/depression       Objective:  Objective   Vitals:   08/07/21 0915  BP: 120/80  Weight: 161 lb (73 kg)  Height: 5\' 3"  (1.6 m)   Body mass index is 28.52 kg/m. Constitutional: Well nourished, well developed female in no acute distress.  HEENT: normal Skin: Warm and dry.  Respiratory:  Normal respiratory effort Neuro: DTRs 2+, Cranial nerves grossly intact Psych: Alert and Oriented x3. No memory deficits. Normal mood and affect.    Time spent caring for patient with >50% in consultation: 15 minutes Assessment/Plan:     21 y.o. G1 P1001 female being seen to discuss and change method of birth control  Rx Xulane birth control patch Follow up as needed and for annual exam   [facility] 08/07/2021, 10:08 AM

## 2021-11-23 IMAGING — CR DG CHEST 2V
1 series · 2 of 2 positions shown · non-contrast
Comparison: None.

CLINICAL DATA: Chest pain

EXAM:
CHEST - 2 VIEW

[Series 1: dg chest 2 view · 0.14mm/px · 2 of 2 slices shown]
[im 1/2]
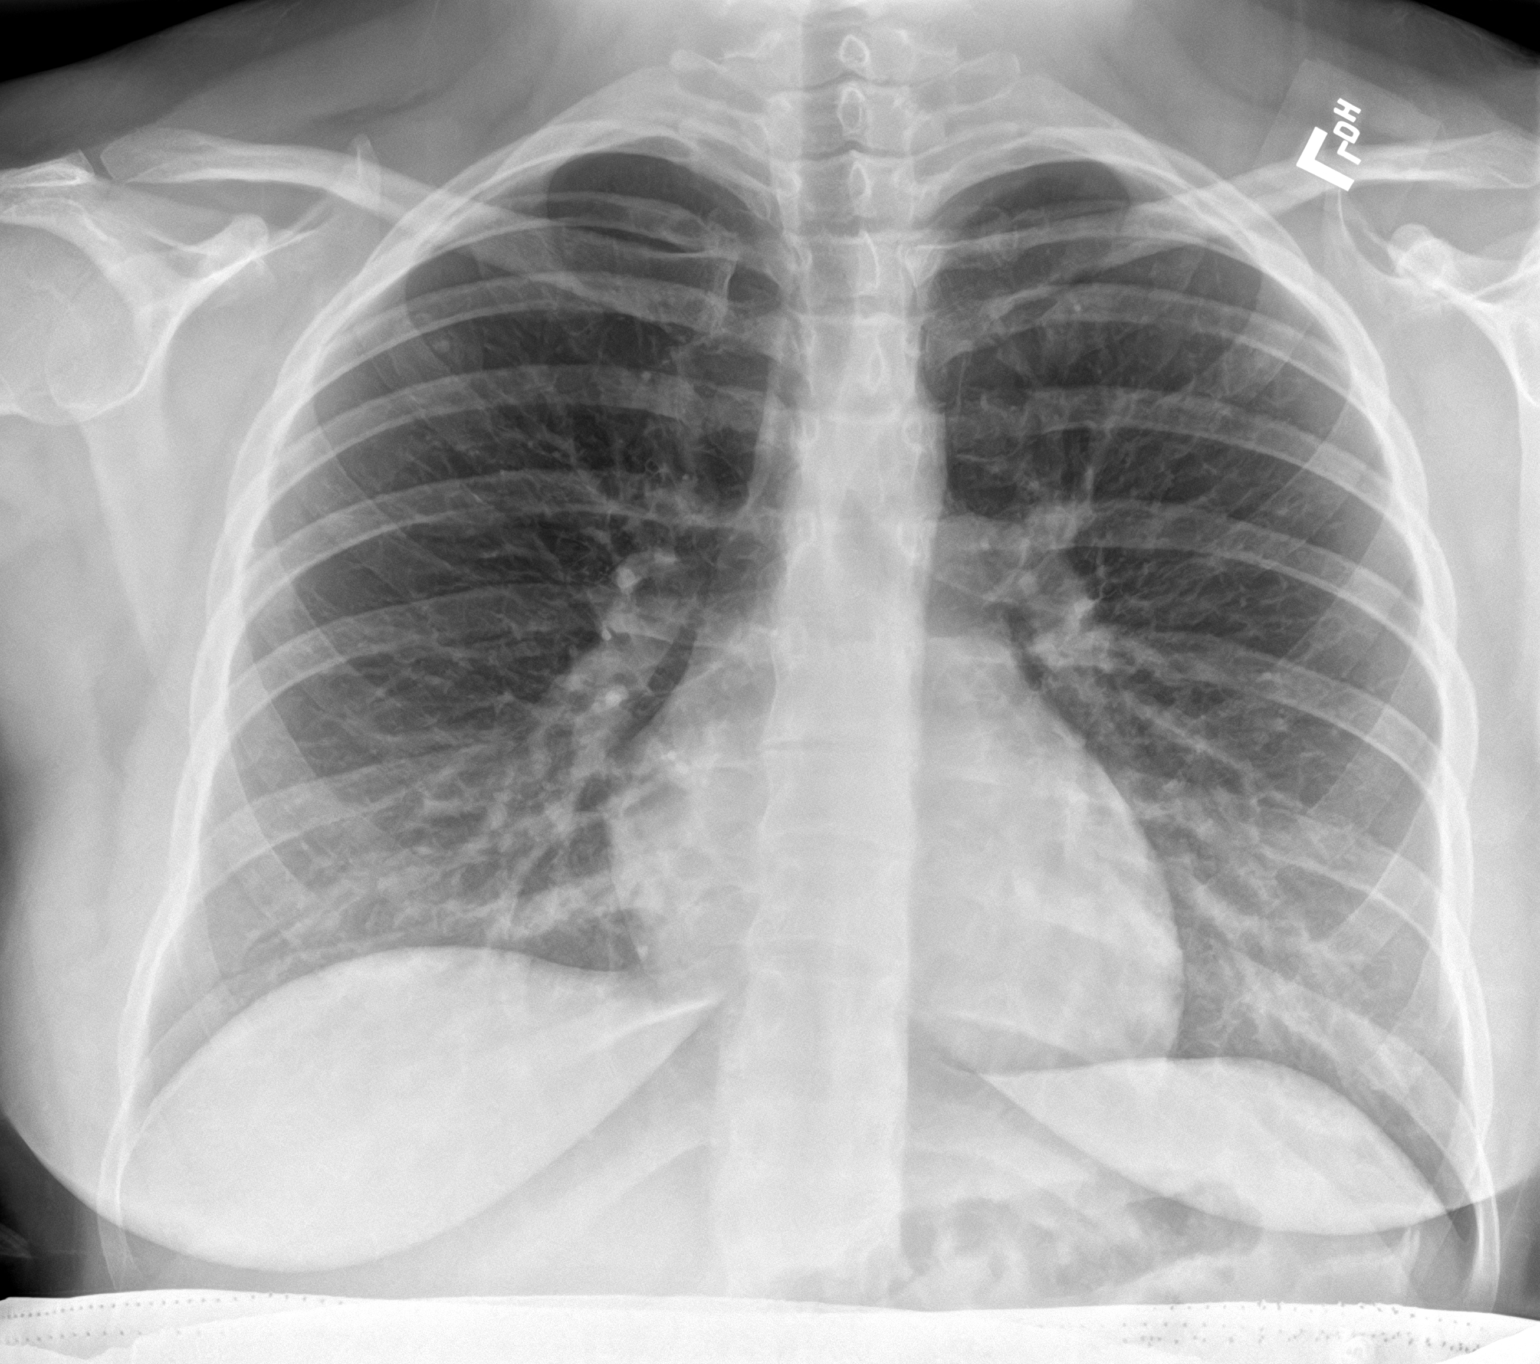
[im 2/2]
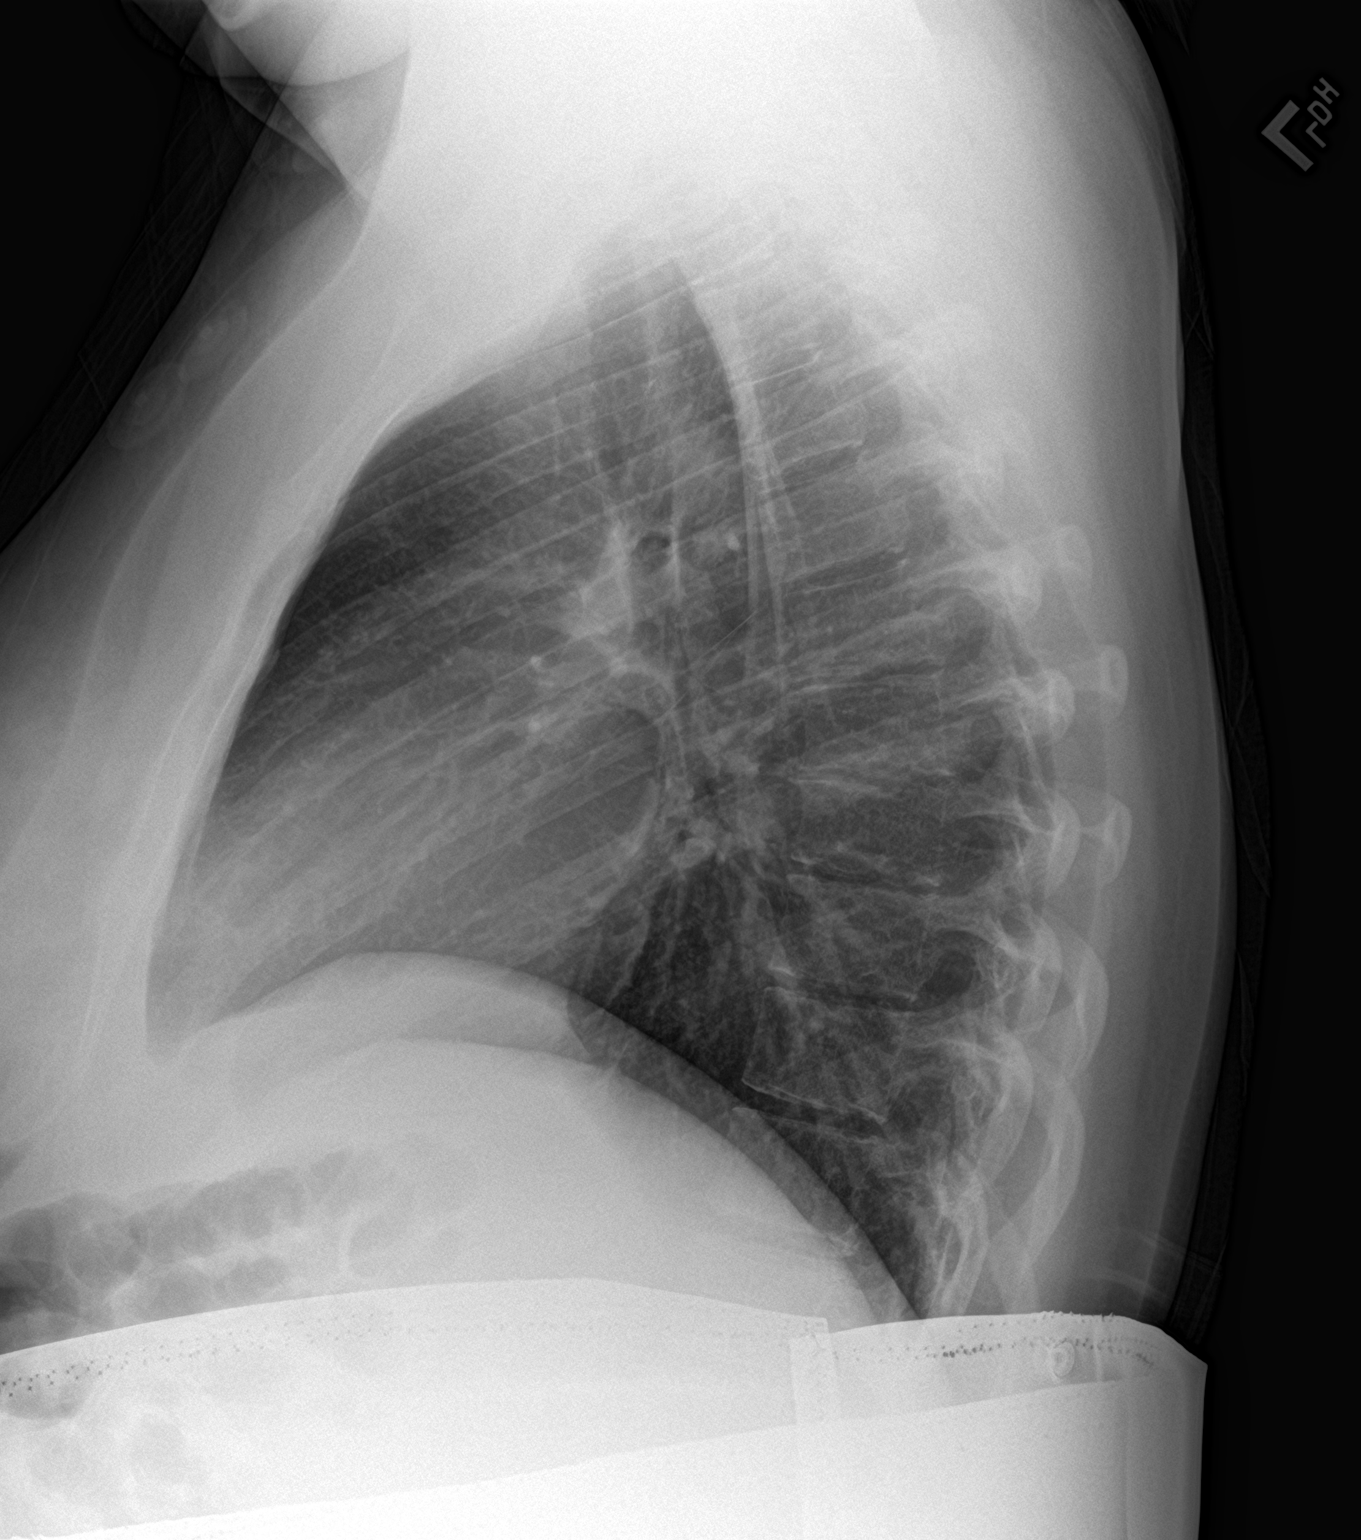

[2 of 2 positions shown; findings below may reference images not displayed]

FINDINGS: Lungs are clear. Heart size and pulmonary vascularity are normal. No
adenopathy. No bone lesions. No pneumothorax.
IMPRESSION: Lungs clear.  Cardiac silhouette normal.

## 2022-02-20 ENCOUNTER — Ambulatory Visit (INDEPENDENT_AMBULATORY_CARE_PROVIDER_SITE_OTHER): Payer: No Typology Code available for payment source

## 2022-02-20 VITALS — BP 102/62 | HR 75 | Ht 63.0 in | Wt 167.0 lb

## 2022-02-20 DIAGNOSIS — N912 Amenorrhea, unspecified: Secondary | ICD-10-CM

## 2022-02-20 LAB — POCT URINE PREGNANCY: Preg Test, Ur: POSITIVE — AB

## 2022-02-20 NOTE — Progress Notes (Signed)
Subjective:    Marissa Kennedy is a 21 y.o. female who presents for evaluation of amenorrhea. She believes she could be pregnant. Pregnancy is desired.  Last period was normal.   Patient's last menstrual period was 01/17/2022 (exact date).    Lab Review Urine HCG: positive    Assessment:    Absence of menstruation.     Plan:    Pregnancy Test:  Positive: EDC: 10/24/2022. Briefly discussed positive results and sent to check out for scheduling of New OB appointments.

## 2022-03-11 ENCOUNTER — Ambulatory Visit: Payer: No Typology Code available for payment source

## 2022-03-25 ENCOUNTER — Other Ambulatory Visit: Payer: Self-pay

## 2022-03-25 ENCOUNTER — Ambulatory Visit (INDEPENDENT_AMBULATORY_CARE_PROVIDER_SITE_OTHER): Payer: No Typology Code available for payment source

## 2022-03-25 VITALS — Wt 167.0 lb

## 2022-03-25 DIAGNOSIS — Z8639 Personal history of other endocrine, nutritional and metabolic disease: Secondary | ICD-10-CM

## 2022-03-25 DIAGNOSIS — Z369 Encounter for antenatal screening, unspecified: Secondary | ICD-10-CM

## 2022-03-25 DIAGNOSIS — Z3480 Encounter for supervision of other normal pregnancy, unspecified trimester: Secondary | ICD-10-CM | POA: Insufficient documentation

## 2022-03-25 DIAGNOSIS — Z3481 Encounter for supervision of other normal pregnancy, first trimester: Secondary | ICD-10-CM

## 2022-03-25 NOTE — Progress Notes (Signed)
New OB Intake  I connected with  Marissa Kennedy on 03/25/22 at  8:15 AM EDT by telephone Video Visit and verified that I am speaking with the correct person using two identifiers. Nurse is located at [facility] and pt is located at home.  I explained I am completing New OB Intake today. We discussed her EDD of 10/24/2022 that is based on LMP of 01/17/2022. Pt is G3/P1011. I reviewed her allergies, medications, Medical/Surgical/OB history, and appropriate screenings. Based on history, this is a/an pregnancy uncomplicated .   Patient Active Problem List   Diagnosis Date Noted   Third degree laceration of perineum during delivery, postpartum 10/16/2020   Vacuum-assisted vaginal delivery 10/15/2020    Concerns addressed today None  Delivery Plans:  Plans to deliver at [facility].  Anatomy US Explained first scheduled [place] will be scheduled for dating/viability. Anatomy US scheduled around 20 weeks.  Labs Discussed genetic screening with patient. Patient desires genetic testing to be drawn with new OB visit. Discussed possible labs to be drawn at new OB appointment.  COVID Vaccine Patient has not had COVID vaccine.   Social Determinants of Health Food Insecurity: denies food insecurity Transportation: Patient denies transportation needs. Childcare: Discussed no children allowed at ultrasound appointments.   First visit review I reviewed new OB appt with pt. I explained she will have ob bloodwork and pap smear/pelvic exam if indicated. Explained pt will be seen by Dr. Nicholaus Bloom at first visit; encounter routed to appropriate provider.   Loran Senters, [facility] 03/25/2022  8:40 AM  Clinical Staff Provider  Office Location  [facility] Dating    Language  English Anatomy US    Flu Vaccine  offer Genetic Screen  NIPS:   TDaP vaccine   offer Hgb A1C or  GTT Early : Third trimester :   Covid declines   LAB RESULTS   Rhogam   Blood Type     Feeding Plan breast  Antibody    Contraception pill Rubella    Circumcision yes RPR     Pediatrician  [facility]     Support Person Jomarie Longs HIV    Prenatal Classes No Varicella     GBS  (For PCN allergy, check sensitivities)   BTL Consent  Hep C   VBAC Consent  Pap      Hgb Electro      CF      SMA

## 2022-03-27 ENCOUNTER — Other Ambulatory Visit: Payer: Self-pay | Admitting: Obstetrics & Gynecology

## 2022-03-27 ENCOUNTER — Ambulatory Visit
Admission: RE | Admit: 2022-03-27 | Discharge: 2022-03-27 | Disposition: A | Discharge status: Home / Self Care | Payer: PRIVATE HEALTH INSURANCE | Source: Ambulatory Visit | Attending: Obstetrics & Gynecology | Admitting: Obstetrics & Gynecology

## 2022-03-27 DIAGNOSIS — Z3481 Encounter for supervision of other normal pregnancy, first trimester: Secondary | ICD-10-CM | POA: Diagnosis present

## 2022-03-27 DIAGNOSIS — Z3A09 9 weeks gestation of pregnancy: Secondary | ICD-10-CM | POA: Diagnosis not present

## 2022-03-27 DIAGNOSIS — Z369 Encounter for antenatal screening, unspecified: Secondary | ICD-10-CM | POA: Insufficient documentation

## 2022-03-29 ENCOUNTER — Other Ambulatory Visit: Payer: No Typology Code available for payment source

## 2022-03-29 DIAGNOSIS — Z3481 Encounter for supervision of other normal pregnancy, first trimester: Secondary | ICD-10-CM

## 2022-03-29 DIAGNOSIS — Z369 Encounter for antenatal screening, unspecified: Secondary | ICD-10-CM

## 2022-04-01 LAB — CBC/D/PLT+RPR+RH+ABO+RUBIGG...
Antibody Screen: NEGATIVE
Basophils Absolute: 0 10*3/uL (ref 0.0–0.2)
Basos: 0 %
EOS (ABSOLUTE): 0 10*3/uL (ref 0.0–0.4)
Eos: 1 %
HCV Ab: NONREACTIVE
HIV Screen 4th Generation wRfx: NONREACTIVE
Hematocrit: 36.2 % (ref 34.0–46.6)
Hemoglobin: 12.5 g/dL (ref 11.1–15.9)
Hepatitis B Surface Ag: NEGATIVE
Immature Grans (Abs): 0 10*3/uL (ref 0.0–0.1)
Immature Granulocytes: 0 %
Lymphocytes Absolute: 1.6 10*3/uL (ref 0.7–3.1)
Lymphs: 20 %
MCH: 29.8 pg (ref 26.6–33.0)
MCHC: 34.5 g/dL (ref 31.5–35.7)
MCV: 86 fL (ref 79–97)
Monocytes Absolute: 0.5 10*3/uL (ref 0.1–0.9)
Monocytes: 6 %
Neutrophils Absolute: 6 10*3/uL (ref 1.4–7.0)
Neutrophils: 73 %
Platelets: 226 10*3/uL (ref 150–450)
RBC: 4.19 x10E6/uL (ref 3.77–5.28)
RDW: 12.9 % (ref 11.7–15.4)
RPR Ser Ql: NONREACTIVE
Rh Factor: POSITIVE
Rubella Antibodies, IGG: 1.57 index (ref >0.99)
Varicella zoster IgG: <135 index — ABNORMAL LOW (ref >165)
WBC: 8.1 10*3/uL (ref 3.4–10.8)

## 2022-04-01 LAB — HCV INTERPRETATION

## 2022-04-08 ENCOUNTER — Encounter: Payer: Self-pay | Admitting: Obstetrics & Gynecology

## 2022-04-08 ENCOUNTER — Ambulatory Visit (INDEPENDENT_AMBULATORY_CARE_PROVIDER_SITE_OTHER): Payer: No Typology Code available for payment source | Admitting: Obstetrics & Gynecology

## 2022-04-08 ENCOUNTER — Other Ambulatory Visit (HOSPITAL_COMMUNITY)
Admission: RE | Admit: 2022-04-08 | Discharge: 2022-04-08 | Disposition: A | Discharge status: Home / Self Care | Payer: PRIVATE HEALTH INSURANCE | Source: Ambulatory Visit | Attending: Obstetrics & Gynecology | Admitting: Obstetrics & Gynecology

## 2022-04-08 ENCOUNTER — Encounter: Payer: No Typology Code available for payment source | Admitting: Obstetrics & Gynecology

## 2022-04-08 VITALS — BP 100/60 | Wt 163.0 lb

## 2022-04-08 DIAGNOSIS — Z3480 Encounter for supervision of other normal pregnancy, unspecified trimester: Secondary | ICD-10-CM | POA: Insufficient documentation

## 2022-04-08 NOTE — Progress Notes (Signed)
Subjective:    Marissa Kennedy is a 21 yo G23P53 (62 month old daughter)  being seen today for her first obstetrical visit.  This is a planned pregnancy. She is at [redacted]w[redacted]d gestation. She had a 9.1 week early ultrasound. Her obstetrical history is significant for  none . Relationship with FOB: spouse, living together. Patient does intend to breast feed. Pregnancy history fully reviewed.  Menstrual History: OB History     Gravida  3   Para  1   Term  1   Preterm  0   AB  1   Living  1      SAB  1   IAB  0   Ectopic  0   Multiple  0   Live Births  1          Patient's last menstrual period was 01/17/2022 (exact date).    The following portions of the patient's history were reviewed and updated as appropriate: allergies, current medications, past family history, past medical history, past social history, past surgical history, and problem list.  Review of Systems Pertinent items are noted in HPI.  Her pap smear was normal in 2022.   Objective:    BP 100/60   Wt 163 lb (73.9 kg)   LMP 01/17/2022 (Exact Date)   BMI 28.87 kg/m   General Appearance:    Alert, cooperative, no distress, appears stated age  Head:    Normocephalic, without obvious abnormality, atraumatic  Eyes:    PERRL, conjunctiva/corneas clear, EOM's intact, fundi    benign, both eyes  Ears:    Normal TM's and external ear canals, both ears  Nose:   Nares normal, septum midline, mucosa normal, no drainage    or sinus tenderness  Throat:   Lips, mucosa, and tongue normal; teeth and gums normal  Neck:   Supple, symmetrical, trachea midline, no adenopathy;    thyroid:  no enlargement/tenderness/nodules; no carotid   bruit or JVD  Back:     Symmetric, no curvature, ROM normal, no CVA tenderness  Lungs:     Clear to auscultation bilaterally, respirations unlabored  Chest Wall:    No tenderness or deformity   Heart:    Regular rate and rhythm, S1 and S2 normal, no murmur, rub   or gallop  Breast Exam:     No tenderness, masses, or nipple abnormality  Abdomen:     Soft, non-tender, bowel sounds active all four quadrants,    no masses, no organomegaly        Extremities:   Extremities normal, atraumatic, no cyanosis or edema  Pulses:   2+ and symmetric all extremities  Skin:   Skin color, texture, turgor normal, no rashes or lesions  Lymph nodes:   Cervical, supraclavicular, and axillary nodes normal  Neurologic:   CNII-XII intact, normal strength, sensation and reflexes    throughout      Assessment:    Pregnancy at 11 and 4/7 weeks    Plan:    Initial labs drawn. Prenatal vitamins. Problem list reviewed and updated. Mat 21 pending Role of ultrasound in pregnancy discussed; fetal survey: ordered. Amniocentesis discussed: not indicated. Follow up in 4 weeks.

## 2022-04-09 LAB — DRUG SCREEN, URINE
Amphetamines, Urine: NEGATIVE ng/mL
Barbiturate screen, urine: NEGATIVE ng/mL
Benzodiazepine Quant, Ur: NEGATIVE ng/mL
Cannabinoid Quant, Ur: NEGATIVE ng/mL
Cocaine (Metab.): NEGATIVE ng/mL
Opiate Quant, Ur: NEGATIVE ng/mL
PCP Quant, Ur: NEGATIVE ng/mL

## 2022-04-09 LAB — CERVICOVAGINAL ANCILLARY ONLY
Bacterial Vaginitis (gardnerella): NEGATIVE
Candida Glabrata: NEGATIVE
Candida Vaginitis: NEGATIVE
Comment: NEGATIVE
Comment: NEGATIVE
Comment: NEGATIVE

## 2022-04-10 LAB — URINE CULTURE

## 2022-04-15 ENCOUNTER — Other Ambulatory Visit: Payer: Self-pay

## 2022-04-15 DIAGNOSIS — Z1379 Encounter for other screening for genetic and chromosomal anomalies: Secondary | ICD-10-CM

## 2022-04-15 LAB — MATERNIT 21 PLUS CORE, BLOOD

## 2022-04-15 LAB — SPECIMEN STATUS REPORT

## 2022-04-16 ENCOUNTER — Other Ambulatory Visit: Payer: PRIVATE HEALTH INSURANCE

## 2022-04-16 DIAGNOSIS — Z1379 Encounter for other screening for genetic and chromosomal anomalies: Secondary | ICD-10-CM

## 2022-04-20 LAB — MATERNIT 21 PLUS CORE, BLOOD
Fetal Fraction: 8
Result (T21): NEGATIVE
Trisomy 13 (Patau syndrome): NEGATIVE
Trisomy 18 (Edwards syndrome): NEGATIVE
Trisomy 21 (Down syndrome): NEGATIVE

## 2022-05-06 ENCOUNTER — Encounter: Payer: Self-pay | Admitting: Licensed Practical Nurse

## 2022-05-06 ENCOUNTER — Ambulatory Visit (INDEPENDENT_AMBULATORY_CARE_PROVIDER_SITE_OTHER): Payer: PRIVATE HEALTH INSURANCE | Admitting: Licensed Practical Nurse

## 2022-05-06 VITALS — BP 100/60 | HR 71 | Wt 165.0 lb

## 2022-05-06 DIAGNOSIS — Z3A15 15 weeks gestation of pregnancy: Secondary | ICD-10-CM

## 2022-05-06 DIAGNOSIS — Z23 Encounter for immunization: Secondary | ICD-10-CM | POA: Diagnosis not present

## 2022-05-06 DIAGNOSIS — Z3482 Encounter for supervision of other normal pregnancy, second trimester: Secondary | ICD-10-CM

## 2022-05-06 DIAGNOSIS — Z3480 Encounter for supervision of other normal pregnancy, unspecified trimester: Secondary | ICD-10-CM

## 2022-05-06 LAB — POCT URINALYSIS DIPSTICK
Blood, UA: NEGATIVE
Glucose, UA: NEGATIVE
Leukocytes, UA: NEGATIVE
Protein, UA: NEGATIVE
Spec Grav, UA: 1.01 (ref 1.010–1.025)
Urobilinogen, UA: 1 E.U./dL
pH, UA: 6.5 (ref 5.0–8.0)

## 2022-05-06 NOTE — Progress Notes (Signed)
Routine Prenatal Care Visit  Subjective  Marissa Kennedy is a 21 y.o. G3P1011 at [redacted]w[redacted]d being seen today for ongoing prenatal care.  She is currently monitored for the following issues for this low-risk pregnancy and has Vacuum-assisted vaginal delivery; Third degree laceration of perineum during delivery, postpartum; and Supervision of other normal pregnancy, antepartum on their problem list.  ----------------------------------------------------------------------------------- Patient reports  pain in left breast, does not feel any masses or changes to her skin .  Here with her husband [name]. Doing well.  Contractions: Not present. Vag. Bleeding: None.  Movement: Present. Leaking Fluid denies.  ----------------------------------------------------------------------------------- The following portions of the patient's history were reviewed and updated as appropriate: allergies, current medications, past family history, past medical history, past social history, past surgical history and problem list. Problem list updated.  Objective  Blood pressure 100/60, pulse 71, weight 165 lb (74.8 kg), last menstrual period 01/17/2022, currently breastfeeding. Pregravid weight 165 lb (74.8 kg) Total Weight Gain 0 lb (0 kg) Urinalysis: Urine Protein    Urine Glucose    Fetal Status: Fetal Heart Rate (bpm): 148   Movement: Present     General:  Alert, oriented and cooperative. Patient is in no acute distress.  Skin: Skin is warm and dry. No rash noted.   Cardiovascular: Normal heart rate noted  Respiratory: Normal respiratory effort, no problems with respiration noted  Abdomen: Soft, gravid, appropriate for gestational age. Pain/Pressure: Absent     Pelvic:  Cervical exam deferred        Extremities: Normal range of motion.     Mental Status: Normal mood and affect. Normal behavior. Normal judgment and thought content.   Assessment   21 y.o. G3P1011 at [redacted]w[redacted]d by  10/24/2022, by Last Menstrual Period  presenting for routine prenatal visit  Plan   Third Problems (from 03/25/22 to present)     No problems associated with this episode.        general obstetric precautions including but not limited to vaginal bleeding, contractions, leaking of fluid and fetal movement were reviewed in detail with the patient. Please refer to After Visit Summary for other counseling recommendations.   Return in about 4 weeks (around 06/03/2022) for ROB. Roberto Scales, [facility]  05/09/22  10:42 AM

## 2022-05-23 ENCOUNTER — Encounter: Payer: Self-pay | Admitting: Licensed Practical Nurse

## 2022-06-05 ENCOUNTER — Ambulatory Visit (INDEPENDENT_AMBULATORY_CARE_PROVIDER_SITE_OTHER): Payer: PRIVATE HEALTH INSURANCE

## 2022-06-05 DIAGNOSIS — Z3482 Encounter for supervision of other normal pregnancy, second trimester: Secondary | ICD-10-CM

## 2022-06-05 DIAGNOSIS — Z3A19 19 weeks gestation of pregnancy: Secondary | ICD-10-CM | POA: Diagnosis not present

## 2022-06-05 DIAGNOSIS — Z3689 Encounter for other specified antenatal screening: Secondary | ICD-10-CM

## 2022-06-05 DIAGNOSIS — Z3480 Encounter for supervision of other normal pregnancy, unspecified trimester: Secondary | ICD-10-CM

## 2022-06-10 ENCOUNTER — Ambulatory Visit (INDEPENDENT_AMBULATORY_CARE_PROVIDER_SITE_OTHER): Payer: PRIVATE HEALTH INSURANCE | Admitting: Licensed Practical Nurse

## 2022-06-10 ENCOUNTER — Encounter: Payer: Self-pay | Admitting: Licensed Practical Nurse

## 2022-06-10 VITALS — BP 110/69 | HR 89 | Wt 172.3 lb

## 2022-06-10 DIAGNOSIS — Z3482 Encounter for supervision of other normal pregnancy, second trimester: Secondary | ICD-10-CM

## 2022-06-10 DIAGNOSIS — Z3480 Encounter for supervision of other normal pregnancy, unspecified trimester: Secondary | ICD-10-CM

## 2022-06-10 DIAGNOSIS — Z3A20 20 weeks gestation of pregnancy: Secondary | ICD-10-CM

## 2022-06-10 LAB — POCT URINALYSIS DIPSTICK
Bilirubin, UA: NEGATIVE
Blood, UA: NEGATIVE
Glucose, UA: NEGATIVE
Ketones, UA: NEGATIVE
Leukocytes, UA: NEGATIVE
Nitrite, UA: NEGATIVE
Protein, UA: NEGATIVE
Spec Grav, UA: 1.015 (ref 1.010–1.025)
Urobilinogen, UA: 1 E.U./dL
pH, UA: 7.5 (ref 5.0–8.0)

## 2022-06-10 NOTE — Addendum Note (Signed)
Addended by: Burtis Junes on: 06/10/2022 10:44 AM   Modules accepted: Orders

## 2022-06-10 NOTE — Progress Notes (Signed)
Routine Prenatal Care Visit  Subjective  Marissa Kennedy is a 21 y.o. G3P1011 at [redacted]w[redacted]d being seen today for ongoing prenatal care.  She is currently monitored for the following issues for this low-risk pregnancy and has Vacuum-assisted vaginal delivery; Third degree laceration of perineum during delivery, postpartum; and Supervision of other normal pregnancy, antepartum on their problem list.  ----------------------------------------------------------------------------------- Patient reports no complaints.  Here witt family, doing well. Feels a little bloated and "comfortable" by the end of the day. Pleased with previous birth experience. Really appreciated the calm and ease of early labor and the calm after.  Contractions: Not present. Vag. Bleeding: None.  Movement: Present. Leaking Fluid denies.  ----------------------------------------------------------------------------------- The following portions of the patient's history were reviewed and updated as appropriate: allergies, current medications, past family history, past medical history, past social history, past surgical history and problem list. Problem list updated.  Objective  Blood pressure 110/69, pulse 89, weight 172 lb 4.8 oz (78.2 kg), last menstrual period 01/17/2022, currently breastfeeding. Pregravid weight 165 lb (74.8 kg) Total Weight Gain 7 lb 4.8 oz (3.311 kg) Urinalysis: Urine Protein    Urine Glucose    Fetal Status: Fetal Heart Rate (bpm): 150   Movement: Present     General:  Alert, oriented and cooperative. Patient is in no acute distress.  Skin: Skin is warm and dry. No rash noted.   Cardiovascular: Normal heart rate noted  Respiratory: Normal respiratory effort, no problems with respiration noted  Abdomen: Soft, gravid, appropriate for gestational age. Pain/Pressure: Absent     Pelvic:  Cervical exam deferred        Extremities: Normal range of motion.  Edema: None  Mental Status: Normal mood and affect. Normal  behavior. Normal judgment and thought content.   Assessment   21 y.o. G3P1011 at [redacted]w[redacted]d by  10/24/2022, by Last Menstrual Period presenting for routine prenatal visit  Plan   Third Problems (from 03/25/22 to present)     No problems associated with this episode.        Preterm labor symptoms and general obstetric precautions including but not limited to vaginal bleeding, contractions, leaking of fluid and fetal movement were reviewed in detail with the patient. Please refer to After Visit Summary for other counseling recommendations.   Return in about 4 weeks (around 07/08/2022) for ROB.  Carie Caddy, CNM   [facility]  06/10/22  10:35 AM

## 2022-07-10 ENCOUNTER — Encounter: Payer: Self-pay | Admitting: Certified Nurse Midwife

## 2022-07-10 ENCOUNTER — Ambulatory Visit (INDEPENDENT_AMBULATORY_CARE_PROVIDER_SITE_OTHER): Payer: PRIVATE HEALTH INSURANCE | Admitting: Certified Nurse Midwife

## 2022-07-10 VITALS — BP 96/63 | HR 85 | Wt 180.7 lb

## 2022-07-10 DIAGNOSIS — Z3A24 24 weeks gestation of pregnancy: Secondary | ICD-10-CM

## 2022-07-10 LAB — POCT URINALYSIS DIPSTICK OB
Bilirubin, UA: NEGATIVE
Blood, UA: NEGATIVE
Glucose, UA: NEGATIVE
Ketones, UA: NEGATIVE
Leukocytes, UA: NEGATIVE
Nitrite, UA: NEGATIVE
POC,PROTEIN,UA: NEGATIVE
Spec Grav, UA: <=1.005 — AB (ref 1.010–1.025)
Urobilinogen, UA: 0.2 E.U./dL
pH, UA: 7.5 (ref 5.0–8.0)

## 2022-07-10 NOTE — Progress Notes (Signed)
ROB doing feeling good movement. Had dizzy spell recently. Reassurance given. Discussed standing , position changes slowly. Discussed glucose screen next visit. She verbalizes and agrees.  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 Discussed work note if needed.  Follow up 4 wks.   Doreene Burke, CNM

## 2022-07-10 NOTE — Patient Instructions (Signed)
Oral Glucose Tolerance Test During Pregnancy Why am I having this test? The oral glucose tolerance test (OGTT) is done to check how your body processes blood sugar (glucose). This is one of several tests used to diagnose diabetes that develops during pregnancy (gestational diabetes mellitus). Gestational diabetes is a short-term form of diabetes that some women develop while they are pregnant. It usually occurs during the second trimester of pregnancy and goes away after delivery. Testing, or screening, for gestational diabetes usually occurs at weeks 24-28 of pregnancy. You may have the OGTT test after having a 1-hour glucose screening test if the results from that test indicate that you may have gestational diabetes. This test may also be needed if: You have a history of gestational diabetes. There is a history of giving birth to very large babies or of losing pregnancies (having stillbirths). You have signs and symptoms of diabetes, such as: Changes in your eyesight. Tingling or numbness in your hands or feet. Changes in hunger, thirst, and urination, and these are not explained by your pregnancy. What is being tested? This test measures the amount of glucose in your blood at different times during a period of 3 hours. This shows how well your body can process glucose. What kind of sample is taken?  Blood samples are required for this test. They are usually collected by inserting a needle into a blood vessel. How do I prepare for this test? For 3 days before your test, eat normally. Have plenty of carbohydrate-rich foods. Follow instructions from your health care provider about: Eating or drinking restrictions on the day of the test. You may be asked not to eat or drink anything other than water (to fast) starting 8-10 hours before the test. Changing or stopping your regular medicines. Some medicines may interfere with this test. Tell a health care provider about: All medicines you are  taking, including vitamins, herbs, eye drops, creams, and over-the-counter medicines. Any blood disorders you have. Any surgeries you have had. Any medical conditions you have. What happens during the test? First, your blood glucose will be measured. This is referred to as your fasting blood glucose because you fasted before the test. Then, you will drink a glucose solution that contains a certain amount of glucose. Your blood glucose will be measured again 1, 2, and 3 hours after you drink the solution. This test takes about 3 hours to complete. You will need to stay at the testing location during this time. During the testing period: Do not eat or drink anything other than the glucose solution. Do not exercise. Do not use any products that contain nicotine or tobacco, such as cigarettes, e-cigarettes, and chewing tobacco. These can affect your test results. If you need help quitting, ask your health care provider. The testing procedure may vary among health care providers and hospitals. How are the results reported? Your results will be reported as milligrams of glucose per deciliter of blood (mg/dL) or millimoles per liter (mmol/L). There is more than one source for screening and diagnosis reference values used to diagnose gestational diabetes. Your health care provider will compare your results to normal values that were established after testing a large group of people (reference values). Reference values may vary among labs and hospitals. For this test ([facility]oustan), reference values are: Fasting: 95 mg/dL (5.3 mmol/L). 1 hour: 180 mg/dL (10.0 mmol/L). 2 hour: 155 mg/dL (8.6 mmol/L). 3 hour: 140 mg/dL (7.8 mmol/L). What do the results mean? Results below the reference values are   considered normal. If two or more of your blood glucose levels are at or above the reference values, you may be diagnosed with gestational diabetes. If only one level is high, your health care provider may  suggest repeat testing or other tests to confirm a diagnosis. Talk with your health care provider about what your results mean. Questions to ask your health care provider Ask your health care provider, or the department that is doing the test: When will my results be ready? How will I get my results? What are my treatment options? What other tests do I need? What are my next steps? Summary The oral glucose tolerance test (OGTT) is one of several tests used to diagnose diabetes that develops during pregnancy (gestational diabetes mellitus). Gestational diabetes is a short-term form of diabetes that some women develop while they are pregnant. You may have the OGTT test after having a 1-hour glucose screening test if the results from that test show that you may have gestational diabetes. You may also have this test if you have any symptoms or risk factors for this type of diabetes. Talk with your health care provider about what your results mean. This information is not intended to replace advice given to you by your health care provider. Make sure you discuss any questions you have with your health care provider. Document Revised: 02/05/2022 Document Reviewed: 12/09/2019 Elsevier Patient Education  2023 Else[employer] Inc.  

## 2022-07-15 NOTE — L&D Delivery Note (Signed)
Date of delivery: 10/26/2022 Estimated Date of Delivery: 10/24/22 Patient's last menstrual period was 01/17/2022 (exact date). EGA: [redacted]w[redacted]d  Delivery Note At 5:07 AM a viable female was delivered via Vaginal, Spontaneous  Presentation:  ROA   APGAR: 9, 9     Weight: 3200 g, 7 pounds 1 ounce Placenta status: Spontaneous, Intact.   Cord: 3 vessels with the following complications: None.  Cord pH: NA  Called to see patient.  Mom pushed well to deliver a viable female infant.  The head followed by shoulders, which delivered without difficulty, and the rest of the body.  No nuchal cord noted.  Baby to mom's chest.  Cord clamped and cut after 3 min delay.  No cord blood obtained.  Placenta delivered spontaneously, intact, with a 3-vessel cord.    All counts correct.  Hemostasis obtained with IV pitocin and fundal massage.      Anesthesia: Epidural Episiotomy: None Lacerations:  none Suture Repair:  NA Est. Blood Loss (mL): 100  Mom to postpartum.  Baby to Couplet care / Skin to Skin.  Marissa Kennedy, CNM 10/26/2022, 5:37 AM

## 2022-08-06 ENCOUNTER — Other Ambulatory Visit: Payer: Self-pay | Admitting: Certified Nurse Midwife

## 2022-08-06 DIAGNOSIS — Z3A28 28 weeks gestation of pregnancy: Secondary | ICD-10-CM

## 2022-08-06 DIAGNOSIS — Z131 Encounter for screening for diabetes mellitus: Secondary | ICD-10-CM

## 2022-08-06 NOTE — Addendum Note (Signed)
Addended by: Minette Headland on: 08/06/2022 04:46 PM   Modules accepted: Orders

## 2022-08-07 ENCOUNTER — Ambulatory Visit (INDEPENDENT_AMBULATORY_CARE_PROVIDER_SITE_OTHER): Payer: PRIVATE HEALTH INSURANCE | Admitting: Obstetrics

## 2022-08-07 ENCOUNTER — Other Ambulatory Visit: Payer: PRIVATE HEALTH INSURANCE

## 2022-08-07 VITALS — BP 99/62 | HR 99 | Wt 185.0 lb

## 2022-08-07 DIAGNOSIS — Z3480 Encounter for supervision of other normal pregnancy, unspecified trimester: Secondary | ICD-10-CM

## 2022-08-07 DIAGNOSIS — Z23 Encounter for immunization: Secondary | ICD-10-CM

## 2022-08-07 DIAGNOSIS — Z3483 Encounter for supervision of other normal pregnancy, third trimester: Secondary | ICD-10-CM

## 2022-08-07 DIAGNOSIS — Z3A28 28 weeks gestation of pregnancy: Secondary | ICD-10-CM

## 2022-08-07 LAB — POCT URINALYSIS DIPSTICK
Bilirubin, UA: NEGATIVE
Blood, UA: NEGATIVE
Glucose, UA: NEGATIVE
Ketones, UA: NEGATIVE
Leukocytes, UA: NEGATIVE
Nitrite, UA: NEGATIVE
Protein, UA: POSITIVE — AB
Spec Grav, UA: 1.015 (ref 1.010–1.025)
Urobilinogen, UA: 1 E.U./dL
pH, UA: 6.5 (ref 5.0–8.0)

## 2022-08-07 NOTE — Progress Notes (Signed)
Routine Prenatal Care Visit  Subjective  Marissa Kennedy is a 22 y.o. G3P1011 at [redacted]w[redacted]d being seen today for ongoing prenatal care.  She is currently monitored for the following issues for this low-risk pregnancy and has Vacuum-assisted vaginal delivery; Third degree laceration of perineum during delivery, postpartum; and Supervision of other normal pregnancy, antepartum on their problem list.  ----------------------------------------------------------------------------------- Patient reports no complaints.  She is having her glucose test today. Contractions: Not present. Vag. Bleeding: None.  Movement: Present. Leaking Fluid denies.  ----------------------------------------------------------------------------------- The following portions of the patient's history were reviewed and updated as appropriate: allergies, current medications, past family history, past medical history, past social history, past surgical history and problem list. Problem list updated.  Objective  Blood pressure 99/62, pulse 99, weight 185 lb (83.9 kg), last menstrual period 01/17/2022, currently breastfeeding. Pregravid weight 165 lb (74.8 kg) Total Weight Gain 20 lb (9.072 kg) Urinalysis: Urine Protein    Urine Glucose    Fetal Status:     Movement: Present     General:  Alert, oriented and cooperative. Patient is in no acute distress.  Skin: Skin is warm and dry. No rash noted.   Cardiovascular: Normal heart rate noted  Respiratory: Normal respiratory effort, no problems with respiration noted  Abdomen: Soft, gravid, appropriate for gestational age. Pain/Pressure: Absent     Pelvic:  Cervical exam deferred        Extremities: Normal range of motion.     Mental Status: Normal mood and affect. Normal behavior. Normal judgment and thought content.   Assessment   22 y.o. G3P1011 at [redacted]w[redacted]d by  10/24/2022, by Last Menstrual Period presenting for routine prenatal visit  Plan   Third Problems (from 03/25/22 to  present)     No problems associated with this episode.        Preterm labor symptoms and general obstetric precautions including but not limited to vaginal bleeding, contractions, leaking of fluid and fetal movement were reviewed in detail with the patient. Please refer to After Visit Summary for other counseling recommendations.   Return in about 2 weeks (around 08/21/2022) for return OB.  Imagene Riches, CNM  08/07/2022 8:31 AM

## 2022-08-07 NOTE — Progress Notes (Signed)
ROB- TDAP/BT consent signed today

## 2022-08-07 NOTE — Addendum Note (Signed)
Addended by: Drenda Freeze on: 08/07/2022 08:52 AM   Modules accepted: Orders

## 2022-08-07 NOTE — Addendum Note (Signed)
Addended by: Inis Sizer on: 08/07/2022 10:28 AM   Modules accepted: Orders

## 2022-08-08 ENCOUNTER — Encounter: Payer: Self-pay | Admitting: Obstetrics

## 2022-08-08 LAB — 28 WEEK RH+PANEL
Basophils Absolute: 0 10*3/uL (ref 0.0–0.2)
Basos: 0 %
EOS (ABSOLUTE): 0.1 10*3/uL (ref 0.0–0.4)
Eos: 1 %
Gestational Diabetes Screen: 150 mg/dL — ABNORMAL HIGH (ref 70–139)
HIV Screen 4th Generation wRfx: NONREACTIVE
Hematocrit: 30.4 % — ABNORMAL LOW (ref 34.0–46.6)
Hemoglobin: 10.5 g/dL — ABNORMAL LOW (ref 11.1–15.9)
Immature Grans (Abs): 0.1 10*3/uL (ref 0.0–0.1)
Immature Granulocytes: 1 %
Lymphocytes Absolute: 1.3 10*3/uL (ref 0.7–3.1)
Lymphs: 15 %
MCH: 30.3 pg (ref 26.6–33.0)
MCHC: 34.5 g/dL (ref 31.5–35.7)
MCV: 88 fL (ref 79–97)
Monocytes Absolute: 0.5 10*3/uL (ref 0.1–0.9)
Monocytes: 6 %
Neutrophils Absolute: 6.4 10*3/uL (ref 1.4–7.0)
Neutrophils: 77 %
Platelets: 209 10*3/uL (ref 150–450)
RBC: 3.47 x10E6/uL — ABNORMAL LOW (ref 3.77–5.28)
RDW: 11.6 % — ABNORMAL LOW (ref 11.7–15.4)
RPR Ser Ql: NONREACTIVE
WBC: 8.4 10*3/uL (ref 3.4–10.8)

## 2022-08-09 ENCOUNTER — Encounter: Payer: Self-pay | Admitting: Certified Nurse Midwife

## 2022-08-09 ENCOUNTER — Other Ambulatory Visit: Payer: Self-pay | Admitting: Certified Nurse Midwife

## 2022-08-09 DIAGNOSIS — O99810 Abnormal glucose complicating pregnancy: Secondary | ICD-10-CM

## 2022-08-09 NOTE — Progress Notes (Signed)
Abnormal 1 hr glucose screen, 3 hour test ordered. PT notified via my chart.   Marissa Kennedy, CNM

## 2022-08-15 ENCOUNTER — Other Ambulatory Visit: Payer: PRIVATE HEALTH INSURANCE

## 2022-08-15 DIAGNOSIS — O99810 Abnormal glucose complicating pregnancy: Secondary | ICD-10-CM

## 2022-08-16 ENCOUNTER — Encounter: Payer: Self-pay | Admitting: Certified Nurse Midwife

## 2022-08-16 LAB — GESTATIONAL GLUCOSE TOLERANCE
Glucose, Fasting: 73 mg/dL (ref 70–94)
Glucose, GTT - 1 Hour: 176 mg/dL (ref 70–179)
Glucose, GTT - 2 Hour: 162 mg/dL — ABNORMAL HIGH (ref 70–154)
Glucose, GTT - 3 Hour: 72 mg/dL (ref 70–139)

## 2022-08-22 ENCOUNTER — Ambulatory Visit (INDEPENDENT_AMBULATORY_CARE_PROVIDER_SITE_OTHER): Payer: PRIVATE HEALTH INSURANCE | Admitting: Obstetrics and Gynecology

## 2022-08-22 ENCOUNTER — Encounter: Payer: Self-pay | Admitting: Obstetrics and Gynecology

## 2022-08-22 VITALS — BP 110/48 | HR 108 | Wt 190.9 lb

## 2022-08-22 DIAGNOSIS — Z3480 Encounter for supervision of other normal pregnancy, unspecified trimester: Secondary | ICD-10-CM

## 2022-08-22 DIAGNOSIS — O99013 Anemia complicating pregnancy, third trimester: Secondary | ICD-10-CM | POA: Insufficient documentation

## 2022-08-22 DIAGNOSIS — Z3A30 30 weeks gestation of pregnancy: Secondary | ICD-10-CM

## 2022-08-22 DIAGNOSIS — D649 Anemia, unspecified: Secondary | ICD-10-CM

## 2022-08-22 LAB — POCT URINALYSIS DIPSTICK OB
Bilirubin, UA: NEGATIVE
Blood, UA: NEGATIVE
Glucose, UA: NEGATIVE
Ketones, UA: NEGATIVE
Leukocytes, UA: NEGATIVE
Nitrite, UA: NEGATIVE
Spec Grav, UA: 1.015
Urobilinogen, UA: 0.2 U/dL
pH, UA: 7

## 2022-08-22 NOTE — Progress Notes (Signed)
ROB: Patient is a 22 y.o. G3P1011 at [id number] for routine OB care. Notes [address].  Notes working a longer shift last night, and had pain in her abdomen from umbilical region to pelvis. Also feels more pelvic pressure. Wears belly band at work. Had abnormal 1 glucola, recently completed 3 hr GTT which is normal. Mild anemia noted, advised on increasing iron in diet, food list included in AVS. RTC in 2 weeks.

## 2022-08-22 NOTE — Progress Notes (Signed)
ROB [redacted]w[redacted]d: She is doing well. She reports good fetal movement. No new concerns today.

## 2022-09-05 ENCOUNTER — Ambulatory Visit (INDEPENDENT_AMBULATORY_CARE_PROVIDER_SITE_OTHER): Payer: PRIVATE HEALTH INSURANCE | Admitting: Obstetrics

## 2022-09-05 VITALS — BP 107/54 | HR 94 | Wt 191.9 lb

## 2022-09-05 DIAGNOSIS — Z3480 Encounter for supervision of other normal pregnancy, unspecified trimester: Secondary | ICD-10-CM

## 2022-09-05 DIAGNOSIS — Z3A33 33 weeks gestation of pregnancy: Secondary | ICD-10-CM

## 2022-09-05 DIAGNOSIS — Z3483 Encounter for supervision of other normal pregnancy, third trimester: Secondary | ICD-10-CM

## 2022-09-05 LAB — POCT URINALYSIS DIPSTICK OB
Bilirubin, UA: NEGATIVE
Blood, UA: NEGATIVE
Glucose, UA: NEGATIVE
Ketones, UA: NEGATIVE
Leukocytes, UA: NEGATIVE
Nitrite, UA: NEGATIVE
POC,PROTEIN,UA: NEGATIVE
Spec Grav, UA: 1.01 (ref 1.010–1.025)
Urobilinogen, UA: 0.2 E.U./dL
pH, UA: 8 (ref 5.0–8.0)

## 2022-09-05 NOTE — Progress Notes (Signed)
Routine Prenatal Care Visit  Subjective  Marissa Kennedy is a 22 y.o. G3P1011 at 75w0dbeing seen today for ongoing prenatal care.  She is currently monitored for the following issues for this low-risk pregnancy and has Vacuum-assisted vaginal delivery; Third degree laceration of perineum during delivery, postpartum; Supervision of other normal pregnancy, antepartum; and Anemia of pregnancy in third trimester on their problem list.  ----------------------------------------------------------------------------------- Patient reports active fetal movement, denies contractions. In discussing her hx of 3rd degree and assisted delivery, she shares a desire to deliver by 40 weeks.  Contractions: Irregular. Vag. Bleeding: None.  Movement: Present. Leaking Fluid denies.  ----------------------------------------------------------------------------------- The following portions of the patient's history were reviewed and updated as appropriate: allergies, current medications, past family history, past medical history, past social history, past surgical history and problem list. Problem list updated.  Objective  Blood pressure (!) 107/54, pulse 94, weight 191 lb 14.4 oz (87 kg), last menstrual period 01/17/2022, currently breastfeeding. Pregravid weight 165 lb (74.8 kg) Total Weight Gain 26 lb 14.4 oz (12.2 kg) Urinalysis: Urine Protein    Urine Glucose    Fetal Status:     Movement: Present     General:  Alert, oriented and cooperative. Patient is in no acute distress.  Skin: Skin is warm and dry. No rash noted.   Cardiovascular: Normal heart rate noted  Respiratory: Normal respiratory effort, no problems with respiration noted  Abdomen: Soft, gravid, appropriate for gestational age. Pain/Pressure: Present     Pelvic:  Cervical exam deferred        Extremities: Normal range of motion.  Edema: Trace  Mental Status: Normal mood and affect. Normal behavior. Normal judgment and thought content.    Assessment   22y.o. G3P1011 at 320w0dy  10/24/2022, by Last Menstrual Period presenting for routine prenatal visit  Hx of vacuum assisted delivery with 3rd degree  Plan   Third Problems (from 03/25/22 to present)     No problems associated with this episode.        Preterm labor symptoms and general obstetric precautions including but not limited to vaginal bleeding, contractions, leaking of fluid and fetal movement were reviewed in detail with the patient. Discussed previous delivery with vacuum assistance and induction; patient does not want an induction this time if possible. Patient desires to be delivered by 40 weeks if favorable. Natural cervical ripening agents discussed; patient showed interest in membrane sweep further in the pregnancy.  Please refer to After Visit Summary for other counseling recommendations.   No follow-ups on file.  MaImagene RichesCNM  09/05/2022 9:47 AM

## 2022-09-17 ENCOUNTER — Encounter: Payer: Self-pay | Admitting: Obstetrics

## 2022-09-19 ENCOUNTER — Ambulatory Visit (INDEPENDENT_AMBULATORY_CARE_PROVIDER_SITE_OTHER): Payer: PRIVATE HEALTH INSURANCE | Admitting: Obstetrics & Gynecology

## 2022-09-19 ENCOUNTER — Encounter: Payer: Self-pay | Admitting: Obstetrics & Gynecology

## 2022-09-19 ENCOUNTER — Other Ambulatory Visit (HOSPITAL_COMMUNITY)
Admission: RE | Admit: 2022-09-19 | Discharge: 2022-09-19 | Disposition: A | Discharge status: Home / Self Care | Payer: No Typology Code available for payment source | Source: Ambulatory Visit | Attending: Obstetrics & Gynecology | Admitting: Obstetrics & Gynecology

## 2022-09-19 VITALS — BP 100/60 | Wt 194.0 lb

## 2022-09-19 DIAGNOSIS — Z3483 Encounter for supervision of other normal pregnancy, third trimester: Secondary | ICD-10-CM | POA: Insufficient documentation

## 2022-09-19 DIAGNOSIS — Z3A35 35 weeks gestation of pregnancy: Secondary | ICD-10-CM | POA: Insufficient documentation

## 2022-09-19 DIAGNOSIS — Z3480 Encounter for supervision of other normal pregnancy, unspecified trimester: Secondary | ICD-10-CM | POA: Diagnosis present

## 2022-09-19 NOTE — Progress Notes (Signed)
   PRENATAL VISIT NOTE  Subjective:  Marissa Kennedy is a 22 y.o. G3P1011 at 71w0dbeing seen today for ongoing prenatal care.  She is currently monitored for the following issues for this low-risk pregnancy and has Vacuum-assisted vaginal delivery; Third degree laceration of perineum during delivery, postpartum; Supervision of other normal pregnancy, antepartum; and Anemia of pregnancy in third trimester on their problem list.  Patient reports no complaints.  Contractions: Irritability. Vag. Bleeding: None.  Movement: Present. Denies leaking of fluid.   The following portions of the patient's history were reviewed and updated as appropriate: allergies, current medications, past family history, past medical history, past social history, past surgical history and problem list.   Objective:   Vitals:   09/19/22 0936  BP: 100/60  Weight: 194 lb (88 kg)    Fetal Status:     Movement: Present     General:  Alert, oriented and cooperative. Patient is in no acute distress.  Skin: Skin is warm and dry. No rash noted.   Cardiovascular: Normal heart rate noted  Respiratory: Normal respiratory effort, no problems with respiration noted  Abdomen: Soft, gravid, appropriate for gestational age.  Pain/Pressure: Present     Pelvic: Cervical exam performed in the presence of a chaperone        Extremities: Normal range of motion.     Mental Status: Normal mood and affect. Normal behavior. Normal judgment and thought content.   Assessment and Plan:  Pregnancy: G3P1011 at 363w0d. Supervision of other normal pregnancy, antepartum  - Culture, beta strep (group b only)  2. [redacted] weeks gestation of pregnancy   Preterm labor symptoms and general obstetric precautions including but not limited to vaginal bleeding, contractions, leaking of fluid and fetal movement were reviewed in detail with the patient. Please refer to After Visit Summary for other counseling recommendations.   Return in about 1  week (around 09/26/2022).  No future appointments.  MyEmily FilbertMD

## 2022-09-19 NOTE — Addendum Note (Signed)
Addended by: Quintella Baton D on: 09/19/2022 09:53 AM   Modules accepted: Orders

## 2022-09-20 LAB — CERVICOVAGINAL ANCILLARY ONLY
Chlamydia: NEGATIVE
Comment: NEGATIVE
Comment: NORMAL
Neisseria Gonorrhea: NEGATIVE

## 2022-09-23 LAB — CULTURE, BETA STREP (GROUP B ONLY): Strep Gp B Culture: NEGATIVE

## 2022-09-27 ENCOUNTER — Ambulatory Visit (INDEPENDENT_AMBULATORY_CARE_PROVIDER_SITE_OTHER): Payer: PRIVATE HEALTH INSURANCE | Admitting: Obstetrics & Gynecology

## 2022-09-27 VITALS — BP 95/64 | HR 93 | Wt 197.6 lb

## 2022-09-27 DIAGNOSIS — Z3A36 36 weeks gestation of pregnancy: Secondary | ICD-10-CM

## 2022-09-27 DIAGNOSIS — O99013 Anemia complicating pregnancy, third trimester: Secondary | ICD-10-CM

## 2022-09-27 DIAGNOSIS — D649 Anemia, unspecified: Secondary | ICD-10-CM

## 2022-09-27 DIAGNOSIS — Z3480 Encounter for supervision of other normal pregnancy, unspecified trimester: Secondary | ICD-10-CM

## 2022-09-27 NOTE — Progress Notes (Signed)
   PRENATAL VISIT NOTE  Subjective:  Marissa Kennedy is a 22 y.o. G3P1011 at [redacted]w[redacted]d being seen today for ongoing prenatal care.  She is currently monitored for the following issues for this low-risk pregnancy and has Supervision of other normal pregnancy, antepartum and Anemia of pregnancy in third trimester on their problem list.  Patient reports no complaints.  Contractions: Irritability. Vag. Bleeding: None.  Movement: Present. Denies leaking of fluid.   The following portions of the patient's history were reviewed and updated as appropriate: allergies, current medications, past family history, past medical history, past social history, past surgical history and problem list.   Objective:   Vitals:   09/27/22 1121  BP: 95/64  Pulse: 93  Weight: 197 lb 9.6 oz (89.6 kg)    Fetal Status:     Movement: Present     General:  Alert, oriented and cooperative. Patient is in no acute distress.  Skin: Skin is warm and dry. No rash noted.   Cardiovascular: Normal heart rate noted  Respiratory: Normal respiratory effort, no problems with respiration noted  Abdomen: Soft, gravid, appropriate for gestational age.  Pain/Pressure: Present     Pelvic: Cervical exam performed in the presence of a chaperone        Extremities: Normal range of motion.  Edema: Trace  Mental Status: Normal mood and affect. Normal behavior. Normal judgment and thought content.  Fingertip/50%/high/vertex Assessment and Plan:  Pregnancy: G3P1011 at [redacted]w[redacted]d 1. [redacted] weeks gestation of pregnancy   2. Supervision of other normal pregnancy, antepartum   3. Anemia of pregnancy in third trimester - iron daily  Preterm labor symptoms and general obstetric precautions including but not limited to vaginal bleeding, contractions, leaking of fluid and fetal movement were reviewed in detail with the patient. Please refer to After Visit Summary for other counseling recommendations.   Return in about 1 week (around  10/04/2022).  No future appointments.  Emily Filbert, MD

## 2022-10-03 ENCOUNTER — Ambulatory Visit (INDEPENDENT_AMBULATORY_CARE_PROVIDER_SITE_OTHER): Payer: No Typology Code available for payment source | Admitting: Obstetrics and Gynecology

## 2022-10-03 VITALS — BP 118/55 | HR 90 | Wt 196.7 lb

## 2022-10-03 DIAGNOSIS — Z3481 Encounter for supervision of other normal pregnancy, first trimester: Secondary | ICD-10-CM

## 2022-10-03 DIAGNOSIS — Z3A37 37 weeks gestation of pregnancy: Secondary | ICD-10-CM

## 2022-10-03 DIAGNOSIS — Z3483 Encounter for supervision of other normal pregnancy, third trimester: Secondary | ICD-10-CM

## 2022-10-03 LAB — POCT URINALYSIS DIPSTICK OB
Bilirubin, UA: NEGATIVE
Blood, UA: NEGATIVE
Glucose, UA: NEGATIVE
Ketones, UA: NEGATIVE
Nitrite, UA: NEGATIVE
Spec Grav, UA: 1.01 (ref 1.010–1.025)
Urobilinogen, UA: 0.2 E.U./dL
pH, UA: 8 (ref 5.0–8.0)

## 2022-10-03 NOTE — Progress Notes (Signed)
ROB: Patient is a 22 y.o. G3P1011 at [redacted]w[redacted]d who presents for routine OB care.  Pregnancy is uncomplicated. Patient has complaints of Braxton Hicks starting yesterday, occurring every 4 minutes, got more uncomfortable at night. Also noting swelling in her ankles and lightening crotch. Discussed home care measures. Patient inquires about membrane sweeping, discussed can perform later in the pregnancy when cervix is more favorable. 36 week cultures negative. RTC in 1 week.

## 2022-10-03 NOTE — Progress Notes (Signed)
ROB [redacted]w[redacted]d: She is doing well. Has been having some contractions this morning. She has pain in her lower pelvis. She has no new concerns.

## 2022-10-09 ENCOUNTER — Ambulatory Visit (INDEPENDENT_AMBULATORY_CARE_PROVIDER_SITE_OTHER): Payer: No Typology Code available for payment source | Admitting: Obstetrics and Gynecology

## 2022-10-09 ENCOUNTER — Encounter: Payer: Self-pay | Admitting: Obstetrics and Gynecology

## 2022-10-09 VITALS — BP 116/63 | HR 104 | Wt 204.0 lb

## 2022-10-09 DIAGNOSIS — Z3481 Encounter for supervision of other normal pregnancy, first trimester: Secondary | ICD-10-CM

## 2022-10-09 DIAGNOSIS — Z3483 Encounter for supervision of other normal pregnancy, third trimester: Secondary | ICD-10-CM

## 2022-10-09 DIAGNOSIS — Z3A37 37 weeks gestation of pregnancy: Secondary | ICD-10-CM

## 2022-10-09 LAB — POCT URINALYSIS DIPSTICK OB
Bilirubin, UA: NEGATIVE
Blood, UA: NEGATIVE
Glucose, UA: NEGATIVE
Ketones, UA: NEGATIVE
Leukocytes, UA: NEGATIVE
Nitrite, UA: NEGATIVE
POC,PROTEIN,UA: NEGATIVE
Spec Grav, UA: 1.01 (ref 1.010–1.025)
Urobilinogen, UA: 0.2 E.U./dL
pH, UA: 7 (ref 5.0–8.0)

## 2022-10-09 NOTE — Progress Notes (Signed)
ROB: Patient is a 23 y.o. G3P1011 at [redacted]w[redacted]d who presents for routine care. Pregnancy uncomplicated. Patient notes she is "over this pregnancy", desires to discuss IOL. Notes IOL last pregnancy at 39 weeks for concern of large infant, but was only ~ 7.5 lbs (review of delivery note reports IOL for elective). Patient also desires membrane sweeping. Reiterated that in order to be successful cervix needs to be more favorable, prefer to do closer to 39 weeks. Cervical exam unchanged from last week, attempted vigorous cervical check. Discussed other natural cervical ripening methods (currently using red raspberry leaf tea).  Has been noting more [address] that have been stronger lately.  Discussed labor precautions. Would not recommend IOL prior to 40 weeks att this time.

## 2022-10-09 NOTE — Progress Notes (Signed)
ROB [redacted]w[redacted]d: She is doing well. She has been having some contractions. They were every 6-8 minutes this morning and now they coming and going. She reports good fetal movement. She would like to have her membranes swept if possible. She would also like to discuss having an induction.

## 2022-10-11 ENCOUNTER — Encounter: Payer: Self-pay | Admitting: Obstetrics and Gynecology

## 2022-10-16 ENCOUNTER — Encounter: Payer: Self-pay | Admitting: Licensed Practical Nurse

## 2022-10-16 ENCOUNTER — Ambulatory Visit (INDEPENDENT_AMBULATORY_CARE_PROVIDER_SITE_OTHER): Payer: No Typology Code available for payment source | Admitting: Licensed Practical Nurse

## 2022-10-16 VITALS — BP 131/85 | HR 114 | Wt 203.2 lb

## 2022-10-16 DIAGNOSIS — Z3481 Encounter for supervision of other normal pregnancy, first trimester: Secondary | ICD-10-CM

## 2022-10-16 DIAGNOSIS — Z3A38 38 weeks gestation of pregnancy: Secondary | ICD-10-CM

## 2022-10-16 DIAGNOSIS — Z3483 Encounter for supervision of other normal pregnancy, third trimester: Secondary | ICD-10-CM

## 2022-10-16 LAB — POCT URINALYSIS DIPSTICK
Bilirubin, UA: NEGATIVE
Blood, UA: NEGATIVE
Glucose, UA: NEGATIVE
Ketones, UA: NEGATIVE
Nitrite, UA: NEGATIVE
Protein, UA: NEGATIVE
Spec Grav, UA: 1.015 (ref 1.010–1.025)
Urobilinogen, UA: 1 E.U./dL
pH, UA: 6.5 (ref 5.0–8.0)

## 2022-10-16 NOTE — Progress Notes (Signed)
Routine Prenatal Care Visit  Subjective  Marissa Kennedy is a 22 y.o. G3P1011 at [redacted]w[redacted]d being seen today for ongoing prenatal care.  She is currently monitored for the following issues for this low-risk pregnancy and has Supervision of other normal pregnancy, antepartum and Anemia of pregnancy in third trimester on their problem list.  ----------------------------------------------------------------------------------- Patient reports  general third trimester discomforts. .  Feels done with this pregnancy.  -Her grandmother and husband will be her labor support, her grandfather will watch their younger child during labor.  -attempted to schedule IOL at 41wks, unable to do so as it is more than 10 days out  -membranes swept  -reviewed methods to encourage labor   Contractions: Irritability. Vag. Bleeding: None.  Movement: Present. Leaking Fluid denies.  ----------------------------------------------------------------------------------- The following portions of the patient's history were reviewed and updated as appropriate: allergies, current medications, past family history, past medical history, past social history, past surgical history and problem list. Problem list updated.  Objective  Blood pressure 131/85, pulse (!) 114, weight 203 lb 3.2 oz (92.2 kg), last menstrual period 01/17/2022, currently breastfeeding. Pregravid weight 165 lb (74.8 kg) Total Weight Gain 38 lb 3.2 oz (17.3 kg) Urinalysis: Urine Protein    Urine Glucose    Fetal Status: Fetal Heart Rate (bpm): 145 Fundal Height: 37 cm Movement: Present  Presentation: Vertex  General:  Alert, oriented and cooperative. Patient is in no acute distress.  Skin: Skin is warm and dry. No rash noted.   Cardiovascular: Normal heart rate noted  Respiratory: Normal respiratory effort, no problems with respiration noted  Abdomen: Soft, gravid, appropriate for gestational age. Pain/Pressure: Absent     Pelvic:  Cervical exam performed  Dilation: 1.5 Effacement (%): 40 Station: -2  Extremities: Normal range of motion.  Edema: None  Mental Status: Normal mood and affect. Normal behavior. Normal judgment and thought content.   Assessment   22 y.o. G3P1011 at [redacted]w[redacted]d by  10/24/2022, by Last Menstrual Period presenting for routine prenatal visit  Plan   Third Problems (from 03/25/22 to present)     No problems associated with this episode.        Term labor symptoms and general obstetric precautions including but not limited to vaginal bleeding, contractions, leaking of fluid and fetal movement were reviewed in detail with the patient. Please refer to After Visit Summary for other counseling recommendations.   Return in about 1 week (around 10/23/2022) for [place].  BPP ordered   Roberto Scales, [facility]  10/16/22  10:54 AM

## 2022-10-24 ENCOUNTER — Ambulatory Visit: Payer: No Typology Code available for payment source

## 2022-10-24 ENCOUNTER — Encounter: Payer: Self-pay | Admitting: Obstetrics

## 2022-10-24 ENCOUNTER — Ambulatory Visit: Payer: No Typology Code available for payment source | Admitting: Obstetrics

## 2022-10-24 VITALS — BP 104/74 | HR 96 | Wt 203.0 lb

## 2022-10-24 DIAGNOSIS — Z3A40 40 weeks gestation of pregnancy: Secondary | ICD-10-CM

## 2022-10-24 DIAGNOSIS — Z3480 Encounter for supervision of other normal pregnancy, unspecified trimester: Secondary | ICD-10-CM

## 2022-10-24 DIAGNOSIS — Z3481 Encounter for supervision of other normal pregnancy, first trimester: Secondary | ICD-10-CM

## 2022-10-24 DIAGNOSIS — O480 Post-term pregnancy: Secondary | ICD-10-CM

## 2022-10-24 LAB — POCT URINALYSIS DIPSTICK OB
Bilirubin, UA: NEGATIVE
Blood, UA: NEGATIVE
Glucose, UA: NEGATIVE
Ketones, UA: NEGATIVE
Leukocytes, UA: NEGATIVE
Nitrite, UA: NEGATIVE
POC,PROTEIN,UA: NEGATIVE
Spec Grav, UA: 1.02 (ref 1.010–1.025)
Urobilinogen, UA: 0.2 E.U./dL
pH, UA: 7 (ref 5.0–8.0)

## 2022-10-24 NOTE — Progress Notes (Signed)
Routine Prenatal Care Visit  Subjective  Marissa Kennedy is a 22 y.o. G3P1011 at [redacted]w[redacted]d being seen today for ongoing prenatal care.  She is currently monitored for the following issues for this low-risk pregnancy and has Supervision of other normal pregnancy, antepartum and Anemia of pregnancy in third trimester on their problem list.  ----------------------------------------------------------------------------------- Patient reports no complaints.   Contractions: Irritability. Vag. Bleeding: None.  Movement: Present. Leaking Fluid denies.  ----------------------------------------------------------------------------------- The following portions of the patient's history were reviewed and updated as appropriate: allergies, current medications, past family history, past medical history, past social history, past surgical history and problem list. Problem list updated.  Objective  Blood pressure 104/74, pulse 96, weight 203 lb (92.1 kg), last menstrual period 01/17/2022, currently breastfeeding. Pregravid weight 165 lb (74.8 kg) Total Weight Gain 38 lb (17.2 kg) Urinalysis: Urine Protein Negative  Urine Glucose Negative  Fetal Status: Fetal Heart Rate (bpm): 140 Fundal Height: 36 cm Movement: Present  Presentation: Vertex  General:  Alert, oriented and cooperative. Patient is in no acute distress.  Skin: Skin is warm and dry. No rash noted.   Cardiovascular: Normal heart rate noted  Respiratory: Normal respiratory effort, no problems with respiration noted  Abdomen: Soft, gravid, appropriate for gestational age. Pain/Pressure: Present     Pelvic:   anterior  Dilation: 2.5 Effacement (%): 30 Station: -3  Extremities: Normal range of motion.  Edema: Trace  Mental Status: Normal mood and affect. Normal behavior. Normal judgment and thought content.   Assessment   22 y.o. G3P1011 at [redacted]w[redacted]d by  10/24/2022, by Last Menstrual Period presenting for routine prenatal visit Desires IOL at 41  weeks  Plan   Third Problems (from 03/25/22 to present)     No problems associated with this episode.        Term labor symptoms and general obstetric precautions including but not limited to vaginal bleeding, contractions, leaking of fluid and fetal movement were reviewed in detail with the patient. Please refer to After Visit Summary for other counseling recommendations.  IOL discussed and desired at 41 wks. Only available date is Saturday, 4/20 at 0500. Will put in admission orders.  Return in about 1 week (around 10/31/2022) for Needs NST, ROB if possible. has IOL set for 4/20.  Mirna Mires, CNM  10/24/2022 4:48 PM

## 2022-10-26 ENCOUNTER — Other Ambulatory Visit: Payer: Self-pay

## 2022-10-26 ENCOUNTER — Inpatient Hospital Stay: Payer: PRIVATE HEALTH INSURANCE | Admitting: Anesthesiology

## 2022-10-26 ENCOUNTER — Encounter: Payer: Self-pay | Admitting: Obstetrics and Gynecology

## 2022-10-26 ENCOUNTER — Inpatient Hospital Stay
Admission: EM | Admit: 2022-10-26 | Discharge: 2022-10-27 | DRG: 807 | Disposition: A | Discharge status: Home / Self Care | Payer: PRIVATE HEALTH INSURANCE | Attending: Advanced Practice Midwife | Admitting: Advanced Practice Midwife

## 2022-10-26 DIAGNOSIS — Z3A40 40 weeks gestation of pregnancy: Secondary | ICD-10-CM

## 2022-10-26 DIAGNOSIS — D649 Anemia, unspecified: Secondary | ICD-10-CM

## 2022-10-26 DIAGNOSIS — Z23 Encounter for immunization: Secondary | ICD-10-CM | POA: Diagnosis not present

## 2022-10-26 DIAGNOSIS — O480 Post-term pregnancy: Principal | ICD-10-CM | POA: Diagnosis present

## 2022-10-26 DIAGNOSIS — O9902 Anemia complicating childbirth: Secondary | ICD-10-CM | POA: Diagnosis present

## 2022-10-26 LAB — CBC
HCT: 32 % — ABNORMAL LOW (ref 36.0–46.0)
Hemoglobin: 10.5 g/dL — ABNORMAL LOW (ref 12.0–15.0)
MCH: 27.2 pg (ref 26.0–34.0)
MCHC: 32.8 g/dL (ref 30.0–36.0)
MCV: 82.9 fL (ref 80.0–100.0)
Platelets: 203 10*3/uL (ref 150–400)
RBC: 3.86 MIL/uL — ABNORMAL LOW (ref 3.87–5.11)
RDW: 13.8 % (ref 11.5–15.5)
WBC: 12.2 10*3/uL — ABNORMAL HIGH (ref 4.0–10.5)
nRBC: 0 % (ref 0.0–0.2)

## 2022-10-26 LAB — TYPE AND SCREEN
ABO/RH(D): A POS
Antibody Screen: NEGATIVE

## 2022-10-26 MED ORDER — OXYTOCIN 10 UNIT/ML IJ SOLN
INTRAMUSCULAR | Status: AC
  Filled 2022-10-26: qty 2

## 2022-10-26 MED ORDER — LIDOCAINE HCL (PF) 1 % IJ SOLN
INTRAMUSCULAR | Status: AC
  Filled 2022-10-26: qty 30

## 2022-10-26 MED ORDER — COCONUT OIL OIL
1.0000 | TOPICAL_OIL | Status: DC | PRN
  Filled 2022-10-26: qty 7.5

## 2022-10-26 MED ORDER — DIBUCAINE (PERIANAL) 1 % EX OINT: 1.0000 | TOPICAL_OINTMENT | CUTANEOUS | Status: DC | PRN

## 2022-10-26 MED ORDER — TETANUS-DIPHTH-ACELL PERTUSSIS 5-2.5-18.5 LF-MCG/0.5 IM SUSY
0.5000 mL | PREFILLED_SYRINGE | Freq: Once | INTRAMUSCULAR | Status: DC
  Filled 2022-10-26: qty 0.5

## 2022-10-26 MED ORDER — FENTANYL-BUPIVACAINE-NACL 0.5-0.125-0.9 MG/250ML-% EP SOLN
EPIDURAL | Status: AC
  Filled 2022-10-26: qty 250

## 2022-10-26 MED ORDER — SENNOSIDES-DOCUSATE SODIUM 8.6-50 MG PO TABS
2.0000 | ORAL_TABLET | Freq: Every day | ORAL | Status: DC
  Administered 2022-10-27: 2 via ORAL
  Filled 2022-10-26: qty 2

## 2022-10-26 MED ORDER — EPHEDRINE 5 MG/ML INJ: 10.0000 mg | INTRAVENOUS | Status: DC | PRN

## 2022-10-26 MED ORDER — PHENYLEPHRINE 80 MCG/ML (10ML) SYRINGE FOR IV PUSH (FOR BLOOD PRESSURE SUPPORT): 80.0000 ug | PREFILLED_SYRINGE | INTRAVENOUS | Status: DC | PRN

## 2022-10-26 MED ORDER — OXYTOCIN-SODIUM CHLORIDE 30-0.9 UT/500ML-% IV SOLN: 2.5000 [IU]/h | INTRAVENOUS | Status: DC

## 2022-10-26 MED ORDER — WITCH HAZEL-GLYCERIN EX PADS
MEDICATED_PAD | CUTANEOUS | Status: AC
  Filled 2022-10-26: qty 100

## 2022-10-26 MED ORDER — OXYTOCIN-SODIUM CHLORIDE 30-0.9 UT/500ML-% IV SOLN
1.0000 m[IU]/min | INTRAVENOUS | Status: DC
  Filled 2022-10-26: qty 500

## 2022-10-26 MED ORDER — BENZOCAINE-MENTHOL 20-0.5 % EX AERO
INHALATION_SPRAY | CUTANEOUS | Status: AC
  Filled 2022-10-26: qty 56

## 2022-10-26 MED ORDER — PRENATAL MULTIVITAMIN CH
1.0000 | ORAL_TABLET | Freq: Every day | ORAL | Status: DC
  Administered 2022-10-26: 1 via ORAL
  Filled 2022-10-26: qty 1

## 2022-10-26 MED ORDER — OXYCODONE-ACETAMINOPHEN 5-325 MG PO TABS: 1.0000 | ORAL_TABLET | ORAL | Status: DC | PRN

## 2022-10-26 MED ORDER — LACTATED RINGERS IV SOLN: 500.0000 mL | Freq: Once | INTRAVENOUS | Status: DC

## 2022-10-26 MED ORDER — SOD CITRATE-CITRIC ACID 500-334 MG/5ML PO SOLN: 30.0000 mL | ORAL | Status: DC | PRN

## 2022-10-26 MED ORDER — MISOPROSTOL 200 MCG PO TABS
ORAL_TABLET | ORAL | Status: AC
  Filled 2022-10-26: qty 4

## 2022-10-26 MED ORDER — ACETAMINOPHEN 325 MG PO TABS: 650.0000 mg | ORAL_TABLET | ORAL | Status: DC | PRN

## 2022-10-26 MED ORDER — LIDOCAINE-EPINEPHRINE (PF) 1.5 %-1:200000 IJ SOLN
INTRAMUSCULAR | Status: DC | PRN
  Administered 2022-10-26: 3 mL via EPIDURAL

## 2022-10-26 MED ORDER — TERBUTALINE SULFATE 1 MG/ML IJ SOLN: 0.2500 mg | Freq: Once | INTRAMUSCULAR | Status: DC | PRN

## 2022-10-26 MED ORDER — BENZOCAINE-MENTHOL 20-0.5 % EX AERO
1.0000 | INHALATION_SPRAY | CUTANEOUS | Status: DC | PRN
  Administered 2022-10-27: 1 via TOPICAL
  Filled 2022-10-26: qty 56

## 2022-10-26 MED ORDER — ONDANSETRON HCL 4 MG PO TABS: 4.0000 mg | ORAL_TABLET | ORAL | Status: DC | PRN

## 2022-10-26 MED ORDER — LIDOCAINE HCL (PF) 1 % IJ SOLN: 30.0000 mL | INTRAMUSCULAR | Status: DC | PRN

## 2022-10-26 MED ORDER — DIPHENHYDRAMINE HCL 50 MG/ML IJ SOLN: 12.5000 mg | INTRAMUSCULAR | Status: DC | PRN

## 2022-10-26 MED ORDER — AMMONIA AROMATIC IN INHA
RESPIRATORY_TRACT | Status: AC
  Filled 2022-10-26: qty 10

## 2022-10-26 MED ORDER — IBUPROFEN 600 MG PO TABS
600.0000 mg | ORAL_TABLET | Freq: Four times a day (QID) | ORAL | Status: DC
  Administered 2022-10-26 – 2022-10-27 (×4): 600 mg via ORAL
  Filled 2022-10-26 (×4): qty 1

## 2022-10-26 MED ORDER — LACTATED RINGERS IV SOLN: INTRAVENOUS | Status: DC

## 2022-10-26 MED ORDER — FENTANYL-BUPIVACAINE-NACL 0.5-0.125-0.9 MG/250ML-% EP SOLN
12.0000 mL/h | EPIDURAL | Status: DC | PRN
  Administered 2022-10-26: 12 mL/h via EPIDURAL

## 2022-10-26 MED ORDER — WITCH HAZEL-GLYCERIN EX PADS
1.0000 | MEDICATED_PAD | CUTANEOUS | Status: DC | PRN
  Administered 2022-10-27: 1 via TOPICAL
  Filled 2022-10-26: qty 100

## 2022-10-26 MED ORDER — OXYTOCIN BOLUS FROM INFUSION: 333.0000 mL | Freq: Once | INTRAVENOUS | Status: DC

## 2022-10-26 MED ORDER — OXYCODONE-ACETAMINOPHEN 5-325 MG PO TABS: 2.0000 | ORAL_TABLET | ORAL | Status: DC | PRN

## 2022-10-26 MED ORDER — ONDANSETRON HCL 4 MG/2ML IJ SOLN: 4.0000 mg | Freq: Four times a day (QID) | INTRAMUSCULAR | Status: DC | PRN

## 2022-10-26 MED ORDER — LIDOCAINE HCL (PF) 1 % IJ SOLN
INTRAMUSCULAR | Status: DC | PRN
  Administered 2022-10-26: 2 mL

## 2022-10-26 MED ORDER — ONDANSETRON HCL 4 MG/2ML IJ SOLN: 4.0000 mg | INTRAMUSCULAR | Status: DC | PRN

## 2022-10-26 MED ORDER — LACTATED RINGERS IV SOLN
500.0000 mL | INTRAVENOUS | Status: DC | PRN
  Administered 2022-10-26: 500 mL via INTRAVENOUS

## 2022-10-26 MED ORDER — DIPHENHYDRAMINE HCL 25 MG PO CAPS: 25.0000 mg | ORAL_CAPSULE | Freq: Four times a day (QID) | ORAL | Status: DC | PRN

## 2022-10-26 MED ORDER — SIMETHICONE 80 MG PO CHEW: 80.0000 mg | CHEWABLE_TABLET | ORAL | Status: DC | PRN

## 2022-10-26 NOTE — Progress Notes (Signed)
Post Partum Day 0 Subjective: Marissa Kennedy is feeling well overall. She is ambulating, voiding, and tolerating POs without difficulty. Her pain is well-controlled and her bleeding is WNL. Her mood is stable. Breastfeeding is going well.  Objective: Blood pressure (!) 102/56, pulse 78, temperature 97.9 F (36.6 C), temperature source Oral, resp. rate 18, height 5\' 3"  (1.6 m), weight 92.1 kg, last menstrual period 01/17/2022, SpO2 99 %, unknown if currently breastfeeding.  Physical Exam:  General: alert and cooperative Lochia: appropriate Uterine Fundus: firm Perineum: healing, no significant edema DVT Evaluation: No evidence of DVT seen on physical exam.  Recent Labs    10/26/22 0229  HGB 10.5*  HCT 32.0*    Assessment/Plan: Routine care Lactation assistance as needed Probable discharge tomorrow   LOS: 0 days   Glenetta Borg, CNM 10/26/2022, 9:09 PM

## 2022-10-26 NOTE — Anesthesia Preprocedure Evaluation (Signed)
Anesthesia Evaluation  Patient identified by MRN, date of birth, ID band Patient awake    Reviewed: Allergy & Precautions, H&P , NPO status , Patient's Chart, lab work & pertinent test results  Airway Mallampati: II  TM Distance: >3 FB Neck ROM: full    Dental no notable dental hx.    Pulmonary neg pulmonary ROS   Pulmonary exam normal        Cardiovascular Exercise Tolerance: Good negative cardio ROS Normal cardiovascular exam     Neuro/Psych    GI/Hepatic negative GI ROS,,,  Endo/Other    Renal/GU   negative genitourinary   Musculoskeletal   Abdominal   Peds  Hematology negative hematology ROS (+)   Anesthesia Other Findings Past Medical History: 2017: Calculus of kidney     Comment:  DR. Zara Chess  Past Surgical History: No date: NO PAST SURGERIES     Reproductive/Obstetrics (+) Pregnancy                              Anesthesia Physical Anesthesia Plan  ASA: 2  Anesthesia Plan: Epidural   Post-op Pain Management:    Induction:   PONV Risk Score and Plan:   Airway Management Planned:   Additional Equipment:   Intra-op Plan:   Post-operative Plan:   Informed Consent: I have reviewed the patients History and Physical, chart, labs and discussed the procedure including the risks, benefits and alternatives for the proposed anesthesia with the patient or authorized representative who has indicated his/her understanding and acceptance.       Plan Discussed with: Anesthesiologist and CRNA  Anesthesia Plan Comments:          Anesthesia Quick Evaluation

## 2022-10-26 NOTE — Discharge Summary (Addendum)
OB Discharge Summary     Patient Name: Marissa Kennedy DOB: [birth year] MRN: [id number]  Date of admission: 10/26/2022 Delivering CNM: Tresea Mall, CNM  Date of Delivery: 10/26/2022  Date of discharge: 10/27/2022  Admitting diagnosis: Post-dates pregnancy [O48.0] Intrauterine pregnancy: [redacted]w[redacted]d     Secondary diagnosis: None     Discharge diagnosis: Term Pregnancy Delivered                                                                                                Post partum procedures: none  Augmentation: N/A  Complications: None  Hospital course:  Onset of Labor With Vaginal Delivery      22 y.o. yo [id number] at [redacted]w[redacted]d was admitted in Active Labor on 10/26/2022. Labor course was complicated by NA  Membrane Rupture Time/Date: 11:00 PM ,10/25/2022   Delivery Method:Vaginal, Spontaneous  Episiotomy: None  Lacerations:  none See delivery note for details  Patient had a postpartum course complicated by NA.  She is ambulating, tolerating a regular diet, passing flatus, and urinating well. She reports breastfeeding is going well.  Patient is discharged home in stable condition on 10/27/22.  Newborn Data: Birth date:10/26/2022  Birth time:5:07 AM  Gender:Female  Living status:Living  Apgars:9 ,9  Weight: 3200 g, 7 pounds 1 ounce  Physical exam  Vitals:   10/26/22 1815 10/26/22 1951 10/27/22 0056 10/27/22 0728  BP: (!) 107/59 (!) 102/56 (!) 95/50 (!) 101/53  Pulse: 65 78 72 63  Resp: 16 18 18 18   Temp: 97.9 F (36.6 C) 97.9 F (36.6 C) 98.1 F (36.7 C) 98 F (36.7 C)  TempSrc: Oral Oral Oral Oral  SpO2: 100% 99% 100% 100%  Weight:      Height:       General: alert, cooperative, and no distress Lochia: appropriate Uterine Fundus: firm Incision: N/A DVT Evaluation: No evidence of DVT seen on physical exam.  Labs: Lab Results  Component Value Date   WBC 11.2 (H) 10/27/2022   HGB 10.2 (L) 10/27/2022   HCT 32.7 (L) 10/27/2022   MCV 85.2 10/27/2022   PLT 191  10/27/2022    Discharge instruction: per After Visit Summary.  Medications:  Allergies as of 10/27/2022   No Known Allergies      Medication List     TAKE these medications    multivitamin-prenatal 27-0.8 MG Tabs tablet Take 1 tablet by mouth daily at 12 noon.        Diet: routine diet  Activity: Advance as tolerated. Pelvic rest for 6 weeks.   Outpatient follow up:  Follow-up Information     Tresea Mall, CNM. Schedule an appointment as soon as possible for a visit.   Specialty: Obstetrics Why: 2 week telephone and 6 week in office postpartum visits Contact information: [address] [place] [address] [phone]                   Postpartum contraception: Progesterone only pills Rhogam Given postpartum: Rh positive Rubella vaccine given postpartum: immune Varicella vaccine given postpartum: yes TDaP given antepartum or postpartum: given antepartum    Newborn Delivery  Birth date/time: 10/26/2022 05:07:00 Delivery type: Vaginal, Spontaneous       Baby Feeding:  breast and formula  Disposition:home with mother  SIGNED:  Tresea Mall, CNM 10/27/2022 11:38 AM

## 2022-10-26 NOTE — Lactation Note (Signed)
This note was copied from a baby's chart. Lactation Consultation Note  Patient Name: Marissa Kennedy GGYIR'S Date: 10/26/2022 Age:24 hours Reason for consult: Initial assessment;Term   Maternal Data Does the patient have breastfeeding experience prior to this delivery?: Yes How long did the patient breastfeed?: 6 mths pumped and bottle fed  Feeding Mother's Current Feeding Choice: Breast Milk BAby was not feeding when I saw pt, mom states baby is nursing well x 2 and latches well with a nipple shield which she used at last feeding, she thinks her nipples are sl flat, I encouraged her to call LC at next feeding to assess need for nipple shield and to provide latch on tips   LATCH Score Latch:  (I did not observe a feeding)  Audible Swallowing: Spontaneous and intermittent  Type of Nipple: Flat  Comfort (Breast/Nipple): Soft / non-tender  Hold (Positioning): Assistance needed to correctly position infant at breast and maintain latch.  LATCH Score: 8   Lactation Tools Discussed/Used  LC name and no written on white board to call for questions or assistance  Interventions Interventions: Breast feeding basics reviewed;Education  Discharge Pump: Personal WIC Program: No  Consult Status Consult Status: PRN    Dyann Kief 10/26/2022, 10:51 AM

## 2022-10-26 NOTE — H&P (Signed)
OB History & Physical   History of Present Illness:  Chief Complaint: water broke/having contractions  HPI:  Marissa Kennedy is a 22 y.o. G82P1011 female at [redacted]w[redacted]d dated by LMP.  Her pregnancy has been complicated by anemia .    She reports contractions beginning around 10:30 PM.   She reports leakage of fluid at 11 PM.   She denies vaginal bleeding.   She reports fetal movement.    Total weight gain for pregnancy: 17.2 kg   Obstetrical Problem List: Third Problems (from 03/25/22 to present)    Nursing Staff Provider  Office Location  [place] Dating  By LMP  Language  English Anatomy US    Flu Vaccine   Genetic Screen  NIPS: negative/female  TDaP vaccine    Hgb A1C or  GTT Early :  Third trimester : 150 3 hr gtt: 1/4 elevated  Covid    LAB RESULTS   Rhogam   Blood Type --/--/A POS (04/13 0229)   Feeding Plan Breast Antibody NEG (04/13 0229)  Contraception  Rubella 1.57 (09/15 1437)  Circumcision  RPR Non Reactive (01/24 0916)   Pediatrician   HBsAg Negative (09/15 1437)   Support Person Husband Jomarie Longs HIV Non Reactive (01/24 0916)  Prenatal Classes  Varicella     GBS Negative/-- (03/07 0924)(For PCN allergy, check sensitivities)   BTL Consent     VBAC Consent  Pap  04/03/21 negative    Hgb Electro    Pelvis Tested  CF      SMA           Maternal Medical History:   Past Medical History:  Diagnosis Date   Calculus of kidney 2017   DR. Zara Chess    Past Surgical History:  Procedure Laterality Date   NO PAST SURGERIES      No Known Allergies  Prior to Admission medications   Medication Sig Start Date End Date Taking? Authorizing Provider  Prenatal Vit-Fe Fumarate-FA (MULTIVITAMIN-PRENATAL) 27-0.8 MG TABS tablet Take 1 tablet by mouth daily at 12 noon.   Yes [provider]    OB History  Gravida Para Term Preterm AB Living  0 1 1  SAB IAB Ectopic Multiple Live Births  1 0 0 0 1    # Outcome Date GA Lbr Len/2nd Weight Sex Delivery Anes PTL  Lv  3 Current           2 Term 10/15/20 [redacted]w[redacted]d 14:19 / 02:44 3520 g F Vag-Vacuum EPI  LIV  1 SAB 06/2019     Biochemical       Prenatal care site: [facility]  Social History: She  reports that she has never smoked. She has never used smokeless tobacco. She reports that she does not drink alcohol and does not use drugs.  Family History: family history includes Depression in her father; Diabetes in her father.    Review of Systems:  Review of Systems  Constitutional:  Negative for chills and fever.  HENT:  Negative for congestion, ear discharge, ear pain, hearing loss, sinus pain and sore throat.   Eyes:  Negative for blurred vision and double vision.  Respiratory:  Negative for cough, shortness of breath and wheezing.   Cardiovascular:  Negative for chest pain, palpitations and leg swelling.  Gastrointestinal:  Positive for abdominal pain. Negative for blood in stool, constipation, diarrhea, heartburn, melena, nausea and vomiting.  Genitourinary:  Negative for dysuria, flank pain, frequency, hematuria and urgency.  Musculoskeletal:  Negative for back pain, joint pain and myalgias.  Skin:  Negative for itching and rash.  Neurological:  Negative for dizziness, tingling, tremors, sensory change, speech change, focal weakness, seizures, loss of consciousness, weakness and headaches.  Endo/Heme/Allergies:  Negative for environmental allergies. Does not bruise/bleed easily.  Psychiatric/Behavioral:  Negative for depression, hallucinations, memory loss, substance abuse and suicidal ideas. The patient is not nervous/anxious and does not have insomnia.      Physical Exam:  BP 129/79   Pulse 75   Temp 98.3 F (36.8 C) (Oral)   Resp 18   LMP 01/17/2022 (Exact Date)   SpO2 100%   Constitutional: Well nourished, well developed female in no acute distress.  HEENT: normal Skin: Warm and dry.  Cardiovascular: Regular rate and rhythm.   Extremity:  trace edema   Respiratory: Clear to  auscultation bilateral. Normal respiratory effort Abdomen: FHT present Back: no CVAT Psych: Alert and Oriented x3. No memory deficits. Normal mood and affect.    Pelvic exam: complete and pushing at the time of this note   Baseline FHR: 130 beats/min   Variability: moderate   Accelerations: present   Decelerations: present/earlies Contractions: present frequency: every 2 minutes Overall assessment: reassuring   Lab Results  Component Value Date   SARSCOV2NAA NEGATIVE 10/14/2020    Assessment:  Marissa Kennedy is a 22 y.o. G90P1011 female at [redacted]w[redacted]d with active labor on admission.   Plan:  Admit to Labor & Delivery  CBC, T&S, Clrs, IVF GBS negative.   Fetal well-being: Category I Expectant management for vaginal delivery    Tresea Mall, CNM 10/26/2022 4:59 AM

## 2022-10-26 NOTE — Anesthesia Preprocedure Evaluation (Signed)
Anesthesia Evaluation  Patient identified by MRN, date of birth, ID band Patient awake    Reviewed: Allergy & Precautions, H&P , NPO status , Patient's Chart, lab work & pertinent test results  Airway Mallampati: II  TM Distance: >3 FB Neck ROM: full    Dental no notable dental hx.    Pulmonary neg pulmonary ROS   Pulmonary exam normal        Cardiovascular Exercise Tolerance: Good negative cardio ROS Normal cardiovascular exam     Neuro/Psych    GI/Hepatic negative GI ROS,,,  Endo/Other    Renal/GU   negative genitourinary   Musculoskeletal   Abdominal   Peds  Hematology  (+) Blood dyscrasia, anemia   Anesthesia Other Findings Past Medical History: 2017: Calculus of kidney     Comment:  DR. Zara Chess  Past Surgical History: No date: NO PAST SURGERIES     Reproductive/Obstetrics (+) Pregnancy                              Anesthesia Physical Anesthesia Plan  ASA: 2  Anesthesia Plan: Epidural   Post-op Pain Management:    Induction:   PONV Risk Score and Plan:   Airway Management Planned:   Additional Equipment:   Intra-op Plan:   Post-operative Plan:   Informed Consent: I have reviewed the patients History and Physical, chart, labs and discussed the procedure including the risks, benefits and alternatives for the proposed anesthesia with the patient or authorized representative who has indicated his/her understanding and acceptance.       Plan Discussed with: Anesthesiologist and CRNA  Anesthesia Plan Comments:          Anesthesia Quick Evaluation

## 2022-10-26 NOTE — Anesthesia Procedure Notes (Signed)
Epidural Patient location during procedure: OB Start time: 10/26/2022 3:46 AM End time: 10/26/2022 3:58 AM  Staffing Anesthesiologist: Foye Deer, MD Performed: anesthesiologist   Preanesthetic Checklist Completed: patient identified, IV checked, site marked, risks and benefits discussed, surgical consent, monitors and equipment checked, pre-op evaluation and timeout performed  Epidural Patient position: sitting Prep: ChloraPrep Patient monitoring: heart rate, continuous pulse ox and blood pressure Approach: midline Location: L3-L4 Injection technique: LOR saline  Needle:  Needle type: Tuohy  Needle gauge: 18 G Needle length: 9 cm Needle insertion depth: 7 cm Catheter type: closed end Catheter size: 20 Guage Catheter at skin depth: 12 cm Test dose: negative and 1.5% lidocaine with Epi 1:200 K  Assessment Events: blood not aspirated, no cerebrospinal fluid, injection not painful, no injection resistance and no paresthesia  Additional Notes Pt noted brief, mild pain in lower back with inserting epidural catheter. No paresthesia noted. Reason for block:procedure for pain

## 2022-10-26 NOTE — Lactation Note (Signed)
This note was copied from a baby's chart. Lactation Consultation Note  Patient Name: Marissa Kennedy [id number] Date: 10/26/2022 Age:24 hours Reason for consult: Follow-up assessment   Maternal Data    Feeding Mother's Current Feeding Choice: Breast Milk I have been unable to observe a feeding at the breast today, mom has been encouraged to call me, but hasn't.  I have checked on her and she has just recently fed baby.  She states she is using the nipple shield on her right breast mostly, baby latches well on left breast without shield.  She states she hears swallows. LATCH Score Latch:  (I did not observe a feeding)                  Lactation Tools Discussed/Used Tools: Nipple Shields Nipple shield size: 24 (used on right breast)  Interventions Interventions:  (Medela nipple shield instruction sheet given)  Discharge    Consult Status Consult Status: Follow-up Date: 10/27/22 Follow-up type: In-patient    Dyann Kief 10/26/2022, 6:49 PM

## 2022-10-27 ENCOUNTER — Encounter: Payer: Self-pay | Admitting: Obstetrics and Gynecology

## 2022-10-27 LAB — CBC
HCT: 32.7 % — ABNORMAL LOW (ref 36.0–46.0)
Hemoglobin: 10.2 g/dL — ABNORMAL LOW (ref 12.0–15.0)
MCH: 26.6 pg (ref 26.0–34.0)
MCHC: 31.2 g/dL (ref 30.0–36.0)
MCV: 85.2 fL (ref 80.0–100.0)
Platelets: 191 10*3/uL (ref 150–400)
RBC: 3.84 MIL/uL — ABNORMAL LOW (ref 3.87–5.11)
RDW: 14.1 % (ref 11.5–15.5)
WBC: 11.2 10*3/uL — ABNORMAL HIGH (ref 4.0–10.5)
nRBC: 0 % (ref 0.0–0.2)

## 2022-10-27 MED ORDER — VARICELLA VIRUS VACCINE LIVE 1350 PFU/0.5ML IJ SUSR
0.5000 mL | Freq: Once | INTRAMUSCULAR | Status: AC
  Administered 2022-10-27: 0.5 mL via SUBCUTANEOUS
  Filled 2022-10-27: qty 0.5

## 2022-10-27 NOTE — Progress Notes (Signed)
Home to self care with partner/verb understanding of d/c instructions

## 2022-10-27 NOTE — Anesthesia Postprocedure Evaluation (Signed)
Anesthesia Post Note  Patient: Caliya Rogus  Procedure(s) Performed: AN AD HOC LABOR EPIDURAL  Patient location during evaluation: [facility] Anesthesia Type: Epidural Level of consciousness: awake and alert Pain management: pain level controlled Vital Signs Assessment: post-procedure vital signs reviewed and stable Respiratory status: spontaneous breathing, nonlabored ventilation and respiratory function stable Cardiovascular status: blood pressure returned to baseline and stable Postop Assessment: no apparent nausea or vomiting Anesthetic complications: no   No notable events documented.   Last Vitals:  Vitals:   10/27/22 0056 10/27/22 0728  BP: (!) 95/50 (!) 101/53  Pulse: 72 63  Resp: 18 18  Temp: 36.7 C 36.7 C  SpO2: 100% 100%    Last Pain:  Vitals:   10/27/22 0846  TempSrc:   PainSc: 0-No pain                 Foye Deer

## 2022-10-29 LAB — RPR: RPR Ser Ql: NONREACTIVE

## 2022-10-31 ENCOUNTER — Other Ambulatory Visit: Payer: No Typology Code available for payment source

## 2022-10-31 ENCOUNTER — Encounter: Payer: No Typology Code available for payment source | Admitting: Obstetrics

## 2022-11-20 ENCOUNTER — Telehealth: Payer: Self-pay

## 2022-11-20 NOTE — Telephone Encounter (Signed)
[facility]- Discharge Call Backs-Left Voicemail about the following below. 1-Do you have any questions or concerns about yourself as you heal? 2-Any concerns or questions about your baby? 3-How was your stay at the hospital? 4- Did our team work together to care for you? You should be receiving a survey in the mail soon.   We would really appreciate it if you could fill that out for us and return it in the mail.  We value the feedback to make improvements and continue the great work we do.   If you have any questions please feel free to call me back at [phone]  

## 2022-12-05 ENCOUNTER — Encounter: Payer: Self-pay | Admitting: Advanced Practice Midwife

## 2022-12-05 ENCOUNTER — Ambulatory Visit: Payer: No Typology Code available for payment source | Admitting: Advanced Practice Midwife

## 2022-12-05 DIAGNOSIS — Z3202 Encounter for pregnancy test, result negative: Secondary | ICD-10-CM | POA: Diagnosis not present

## 2022-12-05 DIAGNOSIS — Z30011 Encounter for initial prescription of contraceptive pills: Secondary | ICD-10-CM

## 2022-12-05 DIAGNOSIS — Z3009 Encounter for other general counseling and advice on contraception: Secondary | ICD-10-CM

## 2022-12-05 LAB — POCT URINE PREGNANCY: Preg Test, Ur: NEGATIVE

## 2022-12-05 MED ORDER — NORETHINDRONE 0.35 MG PO TABS
1.0000 | ORAL_TABLET | Freq: Every day | ORAL | 11 refills | Status: DC
Start: 2022-12-05 — End: 2023-11-07

## 2022-12-05 NOTE — Progress Notes (Signed)
[facility]   Postpartum Visit  Chief Complaint:  Chief Complaint  Patient presents with   Postpartum Care    Mom and baby are doing well, baby is breast fed and latching well. Patient denies any urinary/bowel concerns and states that vaginal bleeding has resolved and at times notices spotting. Patient states that she has been sexually active since birth and would like to discuss OCP.     History of Present Illness: Patient is a 22 y.o. [id number] presents for postpartum visit.  Review the Delivery Report for details.   Date of delivery: 10/26/2022 Type of delivery: Vaginal delivery - Vacuum or forceps assisted  no Episiotomy No.  Laceration: no  Pregnancy or labor problems:  no Any problems since the delivery:  no  Newborn Details:  SINGLETON :  1. BabyGender female. [place] Birth weight: 7 pounds 1 ounce Maternal Details:  Breast or formula feeding: breastfeeding Intercourse: Yes  has been using condoms Contraception after delivery:  POPs Any bowel or bladder issues: No  Post partum depression/anxiety noted:  no Edinburgh Post-Partum Depression Score: 0 Date of last PAP: 2022  no abnormalities   Review of Systems: Review of Systems  Constitutional:  Negative for chills and fever.  HENT:  Negative for congestion, ear discharge, ear pain, hearing loss, sinus pain and sore throat.   Eyes:  Negative for blurred vision and double vision.  Respiratory:  Negative for cough, shortness of breath and wheezing.   Cardiovascular:  Negative for chest pain, palpitations and leg swelling.  Gastrointestinal:  Negative for abdominal pain, blood in stool, constipation, diarrhea, heartburn, melena, nausea and vomiting.  Genitourinary:  Negative for dysuria, flank pain, frequency, hematuria and urgency.  Musculoskeletal:  Negative for back pain, joint pain and myalgias.  Skin:  Negative for itching and rash.  Neurological:  Negative for dizziness, tingling, tremors, sensory change, speech  change, focal weakness, seizures, loss of consciousness, weakness and headaches.  Endo/Heme/Allergies:  Negative for environmental allergies. Does not bruise/bleed easily.  Psychiatric/Behavioral:  Negative for depression, hallucinations, memory loss, substance abuse and suicidal ideas. The patient is not nervous/anxious and does not have insomnia.      Past Medical History:  Past Medical History:  Diagnosis Date   Calculus of kidney 2017   DR. Zara Chess    Past Surgical History:  Past Surgical History:  Procedure Laterality Date   NO PAST SURGERIES      Family History:  Family History  Problem Relation Age of Onset   Diabetes Father        TYPE 2   Depression Father     Social History:  Social History   Socioeconomic History   Marital status: Married    Spouse name: [name]   Number of children: 1   Years of education: 12   Highest education level: Not on file  Occupational History   Occupation: server  Tobacco Use   Smoking status: Never   Smokeless tobacco: Never  Vaping Use   Vaping Use: Former  Substance and Sexual Activity   Alcohol use: No   Drug use: No   Sexual activity: Yes    Partners: Male    Birth control/protection: None  Other Topics Concern   Not on file  Social History Narrative   Not on file   Social Determinants of Health   Financial Resource Strain: Low Risk  (03/25/2022)   Overall Financial Resource Strain (CARDIA)    Difficulty of Paying Living Expenses: Not very hard  Food Insecurity: No Food Insecurity (10/26/2022)   Hunger Vital Sign    Worried About Running Out of Food in the Last Year: Never true    Ran Out of Food in the Last Year: Never true  Transportation Needs: No Transportation Needs (10/26/2022)   PRAPARE - Administrator, Civil Service (Medical): No    Lack of Transportation (Non-Medical): No  Physical Activity: Inactive (03/25/2022)   Exercise Vital Sign    Days of Exercise per Week: 0 days    Minutes of  Exercise per Session: 0 min  Stress: No Stress Concern Present (03/25/2022)   [employer] of Occupational Health - Occupational Stress Questionnaire    Feeling of Stress : Not at all  Social Connections: Moderately Integrated (03/25/2022)   Social Connection and Isolation Panel [NHANES]    Frequency of Communication with Friends and Family: More than three times a week    Frequency of Social Gatherings with Friends and Family: Twice a week    Attends Religious Services: 1 to 4 times per year    Active Member of [employer] or Organizations: No    Attends Banker Meetings: Never    Marital Status: Married  Catering manager Violence: Not At Risk (10/26/2022)   Humiliation, Afraid, Rape, and Kick questionnaire    Fear of Current or Ex-Partner: No    Emotionally Abused: No    Physically Abused: No    Sexually Abused: No    Allergies:  No Known Allergies  Medications: Prior to Admission medications   Medication Sig Start Date End Date Taking? Authorizing Provider  norethindrone (MICRONOR) 0.35 MG tablet Take 1 tablet (0.35 mg total) by mouth daily. 12/05/22  Yes Tresea Mall, CNM  Prenatal Vit-Fe Fumarate-FA (MULTIVITAMIN-PRENATAL) 27-0.8 MG TABS tablet Take 1 tablet by mouth daily at 12 noon.    [provider]    Physical Exam Blood pressure (!) 93/58, pulse 78, weight 176 lb 6.4 oz (80 kg), currently breastfeeding.    General: NAD HEENT: normocephalic, anicteric Pulmonary: No increased work of breathing Abdomen: NABS, soft, non-tender, non-distended.  Umbilicus without lesions.  No hepatomegaly, splenomegaly or masses palpable. No evidence of hernia. Genitourinary:  External: Normal external female genitalia.  Normal urethral meatus, normal Bartholin's and Skene's glands.    Vagina: Normal vaginal mucosa, no evidence of prolapse. Moderate muscle tone  Cervix: Grossly normal in appearance, no bleeding  Uterus: Non-enlarged, mobile, normal contour.  No  CMT  Adnexa: ovaries non-enlarged, no adnexal masses  Rectal: deferred Extremities: no edema, erythema, or tenderness Neurologic: Grossly intact Psychiatric: mood appropriate, affect full    Edinburgh Postnatal Depression Scale - 12/05/22 1315       Edinburgh Postnatal Depression Scale:  In the Past 7 Days   I have been able to laugh and see the funny side of things. 0    I have looked forward with enjoyment to things. 0    I have blamed myself unnecessarily when things went wrong. 0    I have been anxious or worried for no good reason. 0    I have felt scared or panicky for no good reason. 0    Things have been getting on top of me. 0    I have been so unhappy that I have had difficulty sleeping. 0    I have felt sad or miserable. 0    I have been so unhappy that I have been crying. 0    The thought of harming myself  has occurred to me. 0    Edinburgh Postnatal Depression Scale Total 0             Assessment: 22 y.o. [id number] presenting for 6 week postpartum visit  Plan: Problem List Items Addressed This Visit   None Visit Diagnoses     6 weeks postpartum follow-up    -  Primary   Relevant Medications   norethindrone (MICRONOR) 0.35 MG tablet   Other Relevant Orders   POCT urine pregnancy (Completed)   Encounter for counseling regarding contraception       Relevant Orders   POCT urine pregnancy (Completed)   Encounter for initial prescription of contraceptive pills       Relevant Medications   norethindrone (MICRONOR) 0.35 MG tablet        1) Contraception - Education given regarding options for contraception, as well as compatibility with breast feeding if applicable.  Patient plans on oral progesterone-only contraceptive for contraception.  2)  Pap -ASCCP guidelines and rationale discussed.  Patient opts for every 3 years screening interval. Due 2025  3) Patient underwent screening for postpartum depression with no signs of depression  4) Return in about  1 year (around 12/05/2023) for annual established gyn.   Tresea Mall, CNM [facility] 12/05/2022 5:34 PM

## 2023-02-05 ENCOUNTER — Encounter: Payer: Self-pay | Admitting: Advanced Practice Midwife

## 2023-02-06 ENCOUNTER — Encounter: Payer: Self-pay | Admitting: Obstetrics

## 2023-02-06 ENCOUNTER — Ambulatory Visit: Payer: No Typology Code available for payment source | Admitting: Obstetrics

## 2023-02-06 VITALS — Ht 63.0 in | Wt 174.0 lb

## 2023-02-06 DIAGNOSIS — N926 Irregular menstruation, unspecified: Secondary | ICD-10-CM

## 2023-02-06 NOTE — Progress Notes (Signed)
   GYN ENCOUNTER   Subjective  HPI: Marissa Kennedy is a 22 y.o. [id number] who presents today for abnormal bleeding on POPs. She had a NSVD on 10/26/22. She is currently breastfeeding. She resumed her menses in late May. She started taking POPs around 6 weeks postpartum.  Since then, she has had very irregular bleeding on and off throughout the month. Sometimes it is very heavy, requiring tampon change q hour. Sometimes it is just spotting. She has also starting having frequent headaches and migraines. She denies abnormal discharge, pelvic pain, exposure to STIs, new partners. She has just started on her third pill pack.  Past Medical History:  Diagnosis Date   Calculus of kidney 2017   DR. LARRY SYKES   Past Surgical History:  Procedure Laterality Date   NO PAST SURGERIES     OB History     Gravida  3   Para  2   Term  2   Preterm  0   AB  1   Living  2      SAB  1   IAB  0   Ectopic  0   Multiple  0   Live Births  2          No Known Allergies  Constitutional: Denied constitutional symptoms, night sweats, recent illness, fatigue, fever, insomnia and weight loss.  Genitourinary: Denied genitourinary symptoms including symptomatic vaginal discharge, pelvic relaxation issues, and urinary complaints. +abnormal menstrual bleeding    Objective  Ht 5\' 3"  (1.6 m)   Wt 174 lb (78.9 kg)   LMP  (LMP Unknown)   Breastfeeding Yes   BMI 30.82 kg/m   Physical examination Not medically indicated  Assessment  1) Irregular menstrual bleeding, on POPs  Plan  1) Discussed irregular bleeding patterns when starting new hormonal contraceptives. Declines swabs today. Wynonia feels the side effects are tolerable for a limited time and would prefer to finish 3 full months of POPs to see if her cycle regulates. We discussed all available contraceptive options. If her bleeding pattern does not improve, she would like to switch to NuvaRing. Instructions on proper usage  discussed. She will reach out if she desires a contraceptive change.  Return for annual visit or PRN   Guadlupe Spanish, CNM

## 2023-11-07 ENCOUNTER — Telehealth: Payer: Self-pay

## 2023-11-07 DIAGNOSIS — Z30011 Encounter for initial prescription of contraceptive pills: Secondary | ICD-10-CM

## 2023-11-07 MED ORDER — NORETHINDRONE 0.35 MG PO TABS
1.0000 | ORAL_TABLET | Freq: Every day | ORAL | 0 refills | Status: DC
Start: 2023-11-07 — End: 2023-12-02

## 2023-11-07 NOTE — Telephone Encounter (Signed)
 Chart reviewed. Patient advised she is due for annual on/after 12/05/23. Transferred to front desk for scheduling. Advised will send refill.

## 2023-11-07 NOTE — Telephone Encounter (Signed)
 TRIAGE VOICEMAIL: Patient requesting refill of norethindrone  (MICRONOR ) 0.35 MG tablet .

## 2023-11-29 ENCOUNTER — Other Ambulatory Visit: Payer: Self-pay | Admitting: Obstetrics

## 2023-11-29 DIAGNOSIS — Z30011 Encounter for initial prescription of contraceptive pills: Secondary | ICD-10-CM

## 2023-12-05 NOTE — Patient Instructions (Signed)
 Preventive Care 55-25 Years Old, Female Preventive care refers to lifestyle choices and visits with your health care provider that can promote health and wellness. Preventive care visits are also called wellness exams. What can I expect for my preventive care visit? Counseling During your preventive care visit, your health care provider may ask about your: Medical history, including: Past medical problems. Family medical history. Pregnancy history. Current health, including: Menstrual cycle. Method of birth control. Emotional well-being. Home life and relationship well-being. Sexual activity and sexual health. Lifestyle, including: Alcohol, nicotine or tobacco, and drug use. Access to firearms. Diet, exercise, and sleep habits. Work and work Astronomer. Sunscreen use. Safety issues such as seatbelt and bike helmet use. Physical exam Your health care provider may check your: Height and weight. These may be used to calculate your BMI (body mass index). BMI is a measurement that tells if you are at a healthy weight. Waist circumference. This measures the distance around your waistline. This measurement also tells if you are at a healthy weight and may help predict your risk of certain diseases, such as type 2 diabetes and high blood pressure. Heart rate and blood pressure. Body temperature. Skin for abnormal spots. What immunizations do I need?  Vaccines are usually given at various ages, according to a schedule. Your health care provider will recommend vaccines for you based on your age, medical history, and lifestyle or other factors, such as travel or where you work. What tests do I need? Screening Your health care provider may recommend screening tests for certain conditions. This may include: Pelvic exam and Pap test. Lipid and cholesterol levels. Diabetes screening. This is done by checking your blood sugar (glucose) after you have not eaten for a while (fasting). Hepatitis  B test. Hepatitis C test. HIV (human immunodeficiency virus) test. STI (sexually transmitted infection) testing, if you are at risk. BRCA-related cancer screening. This may be done if you have a family history of breast, ovarian, tubal, or peritoneal cancers. Talk with your health care provider about your test results, treatment options, and if necessary, the need for more tests. Follow these instructions at home: Eating and drinking  Eat a healthy diet that includes fresh fruits and vegetables, whole grains, lean protein, and low-fat dairy products. Take vitamin and mineral supplements as recommended by your health care provider. Do not drink alcohol if: Your health care provider tells you not to drink. You are pregnant, may be pregnant, or are planning to become pregnant. If you drink alcohol: Limit how much you have to 0-1 drink a day. Know how much alcohol is in your drink. In the [place] one drink equals one 12 oz bottle of beer (355 mL), one 5 oz glass of wine (148 mL), or one 1 oz glass of hard liquor (44 mL). Lifestyle Brush your teeth every morning and night with fluoride toothpaste. Floss one time each day. Exercise for at least 30 minutes 5 or more days each week. Do not use any products that contain nicotine or tobacco. These products include cigarettes, chewing tobacco, and vaping devices, such as e-cigarettes. If you need help quitting, ask your health care provider. Do not use drugs. If you are sexually active, practice safe sex. Use a condom or other form of protection to prevent STIs. If you do not wish to become pregnant, use a form of birth control. If you plan to become pregnant, see your health care provider for a prepregnancy visit. Find healthy ways to manage stress, such as: Meditation,  yoga, or listening to music. Journaling. Talking to a trusted person. Spending time with friends and family. Minimize exposure to UV radiation to reduce your risk of skin  cancer. Safety Always wear your seat belt while driving or riding in a vehicle. Do not drive: If you have been drinking alcohol. Do not ride with someone who has been drinking. If you have been using any mind-altering substances or drugs. While texting. When you are tired or distracted. Wear a helmet and other protective equipment during sports activities. If you have firearms in your house, make sure you follow all gun safety procedures. Seek help if you have been physically or sexually abused. What's next? Go to your health care provider once a year for an annual wellness visit. Ask your health care provider how often you should have your eyes and teeth checked. Stay up to date on all vaccines. This information is not intended to replace advice given to you by your health care provider. Make sure you discuss any questions you have with your health care provider. Document Revised: 12/27/2020 Document Reviewed: 12/27/2020 Elsevier Patient Education  2024 Elsevier Inc. Breast Self-Awareness Breast self-awareness is knowing how your breasts look and feel. You need to: Check your breasts on a regular basis. Tell your doctor about any changes. Become familiar with the look and feel of your breasts. This can help you catch a breast problem while it is still small and can be treated. You should do breast self-exams even if you have breast implants. What you need: A mirror. A well-lit room. A pillow or other soft object. How to do a breast self-exam Follow these steps to do a breast self-exam: Look for changes  Take off all the clothes above your waist. Stand in front of a mirror in a room with good lighting. Put your hands down at your sides. Compare your breasts in the mirror. Look for any difference between them, such as: A difference in shape. A difference in size. Wrinkles, dips, and bumps in one breast and not the other. Look at each breast for changes in the skin, such  as: Redness. Scaly areas. Skin that has gotten thicker. Dimpling. Open sores (ulcers). Look for changes in your nipples, such as: Fluid coming out of a nipple. Fluid around a nipple. Bleeding. Dimpling. Redness. A nipple that looks pushed in (retracted), or that has changed position. Feel for changes Lie on your back. Feel each breast. To do this: Pick a breast to feel. Place a pillow under the shoulder closest to that breast. Put the arm closest to that breast behind your head. Feel the nipple area of that breast using the hand of your other arm. Feel the area with the pads of your three middle fingers by making small circles with your fingers. Use light, medium, and firm pressure. Continue the overlapping circles, moving downward over the breast. Keep making circles with your fingers. Stop when you feel your ribs. Start making circles with your fingers again, this time going upward until you reach your collarbone. Then, make circles outward across your breast and into your armpit area. Squeeze your nipple. Check for discharge and lumps. Repeat these steps to check your other breast. Sit or stand in the tub or shower. With soapy water on your skin, feel each breast the same way you did when you were lying down. Write down what you find Writing down what you find can help you remember what to tell your doctor. Write down: What is  normal for each breast. Any changes you find in each breast. These include: The kind of changes you find. A tender or painful breast. Any lump you find. Write down its size and where it is. When you last had your monthly period (menstrual cycle). General tips If you are breastfeeding, the best time to check your breasts is after you feed your baby or after you use a breast pump. If you get monthly bleeding, the best time to check your breasts is 5-7 days after your monthly cycle ends. With time, you will become comfortable with the self-exam. You will  also start to know if there are changes in your breasts. Contact a doctor if: You see a change in the shape or size of your breasts or nipples. You see a change in the skin of your breast or nipples, such as red or scaly skin. You have fluid coming from your nipples that is not normal. You find a new lump or thick area. You have breast pain. You have any concerns about your breast health. Summary Breast self-awareness includes looking for changes in your breasts and feeling for changes within your breasts. You should do breast self-awareness in front of a mirror in a well-lit room. If you get monthly periods (menstrual cycles), the best time to check your breasts is 5-7 days after your period ends. Tell your doctor about any changes you see in your breasts. Changes include changes in size, changes on the skin, painful or tender breasts, or fluid from your nipples that is not normal. This information is not intended to replace advice given to you by your health care provider. Make sure you discuss any questions you have with your health care provider. Document Revised: 12/06/2021 Document Reviewed: 05/03/2021 Elsevier Patient Education  2024 [employer].

## 2023-12-05 NOTE — Progress Notes (Unsigned)
 GYNECOLOGY ANNUAL PHYSICAL EXAM PROGRESS NOTE  Subjective:    Marissa Kennedy is a 23 y.o. [phone] female who presents for an annual exam.  The patient {is/is not/has never been:13135} sexually active. The patient participates in regular exercise: {yes/no/not asked:9010}. Has the patient ever been transfused or tattooed?: {yes/no/not asked:9010}. The patient reports that there {is/is not:9024} domestic violence in her life.   The patient has the following complaints today:   Menstrual History: Menarche age: *** No LMP recorded. (Menstrual status: Other).     Gynecologic History:  Contraception: OCP (estrogen/progesterone) History of STI's:  Last Pap: 04/03/2021. Results were: normal. Denies h/o abnormal pap smears.     OB History  Gravida Para Term Preterm AB Living  3 2 2  0 1 2  SAB IAB Ectopic Multiple Live Births  1 0 0 0 2    # Outcome Date GA Lbr Len/2nd Weight Sex Type Anes PTL Lv  3 Term 10/26/22 [redacted]w[redacted]d  7 lb 0.9 oz (3.2 kg) F Vag-Spont EPI  LIV     Birth Comments: N/A     Name: Marissa Kennedy     Apgar1: 9  Apgar5: 9  2 Term 10/15/20 [redacted]w[redacted]d 14:19 / 02:44 7 lb 12.2 oz (3.52 kg) F Vag-Vacuum EPI  LIV     Name: Marissa Kennedy     Apgar1: 9  Apgar5: 9  1 SAB 06/2019     Biochemical       Past Medical History:  Diagnosis Date   Calculus of kidney 2017   DR. Barkley Boot    Past Surgical History:  Procedure Laterality Date   NO PAST SURGERIES      Family History  Problem Relation Age of Onset   Diabetes Father        TYPE 2   Depression Father     Social History   Socioeconomic History   Marital status: Married    Spouse name: [name]   Number of children: 1   Years of education: 12   Highest education level: Not on file  Occupational History   Occupation: server  Tobacco Use   Smoking status: Never   Smokeless tobacco: Never  Vaping Use   Vaping status: Former  Substance and Sexual Activity   Alcohol use: No   Drug use:  No   Sexual activity: Yes    Partners: Male    Birth control/protection: None  Other Topics Concern   Not on file  Social History Narrative   Not on file   Social Drivers of Health   Financial Resource Strain: Low Risk  (03/25/2022)   Overall Financial Resource Strain (CARDIA)    Difficulty of Paying Living Expenses: Not very hard  Food Insecurity: No Food Insecurity (10/26/2022)   Hunger Vital Sign    Worried About Running Out of Food in the Last Year: Never true    Ran Out of Food in the Last Year: Never true  Transportation Needs: No Transportation Needs (10/26/2022)   PRAPARE - Administrator, Civil Service (Medical): No    Lack of Transportation (Non-Medical): No  Physical Activity: Inactive (03/25/2022)   Exercise Vital Sign    Days of Exercise per Week: 0 days    Minutes of Exercise per Session: 0 min  Stress: No Stress Concern Present (03/25/2022)   [employer] of Occupational Health - Occupational Stress Questionnaire    Feeling of Stress : Not at all  Social Connections: Moderately Integrated (03/25/2022)   Social  Connection and Isolation Panel [NHANES]    Frequency of Communication with Friends and Family: More than three times a week    Frequency of Social Gatherings with Friends and Family: Twice a week    Attends Religious Services: 1 to 4 times per year    Active Member of [employer] or Organizations: No    Attends Banker Meetings: Never    Marital Status: Married  Catering manager Violence: Not At Risk (10/26/2022)   Humiliation, Afraid, Rape, and Kick questionnaire    Fear of Current or Ex-Partner: No    Emotionally Abused: No    Physically Abused: No    Sexually Abused: No    Current Outpatient Medications on File Prior to Visit  Medication Sig Dispense Refill   norethindrone  (MICRONOR ) 0.35 MG tablet Take 1 tablet by mouth once daily 28 tablet 0   Prenatal Vit-Fe Fumarate-FA (MULTIVITAMIN-PRENATAL) 27-0.8 MG TABS tablet Take 1  tablet by mouth daily at 12 noon.     No current facility-administered medications on file prior to visit.    No Known Allergies   Review of Systems Constitutional: negative for chills, fatigue, fevers and sweats Eyes: negative for irritation, redness and visual disturbance Ears, nose, mouth, throat, and face: negative for hearing loss, nasal congestion, snoring and tinnitus Respiratory: negative for asthma, cough, sputum Cardiovascular: negative for chest pain, dyspnea, exertional chest pressure/discomfort, irregular heart beat, palpitations and syncope Gastrointestinal: negative for abdominal pain, change in bowel habits, nausea and vomiting Genitourinary: negative for abnormal menstrual periods, genital lesions, sexual problems and vaginal discharge, dysuria and urinary incontinence Integument/breast: negative for breast lump, breast tenderness and nipple discharge Hematologic/lymphatic: negative for bleeding and easy bruising Musculoskeletal:negative for back pain and muscle weakness Neurological: negative for dizziness, headaches, vertigo and weakness Endocrine: negative for diabetic symptoms including polydipsia, polyuria and skin dryness Allergic/Immunologic: negative for hay fever and urticaria      Objective:  currently breastfeeding. There is no height or weight on file to calculate BMI.    General Appearance:    Alert, cooperative, no distress, appears stated age  Head:    Normocephalic, without obvious abnormality, atraumatic  Eyes:    PERRL, conjunctiva/corneas clear, EOM's intact, both eyes  Ears:    Normal external ear canals, both ears  Nose:   Nares normal, septum midline, mucosa normal, no drainage or sinus tenderness  Throat:   Lips, mucosa, and tongue normal; teeth and gums normal  Neck:   Supple, symmetrical, trachea midline, no adenopathy; thyroid: no enlargement/tenderness/nodules; no carotid bruit or JVD  Back:     Symmetric, no curvature, ROM normal, no CVA  tenderness  Lungs:     Clear to auscultation bilaterally, respirations unlabored  Chest Wall:    No tenderness or deformity   Heart:    Regular rate and rhythm, S1 and S2 normal, no murmur, rub or gallop  Breast Exam:    No tenderness, masses, or nipple abnormality  Abdomen:     Soft, non-tender, bowel sounds active all four quadrants, no masses, no organomegaly.    Genitalia:    Pelvic:external genitalia normal, vagina without lesions, discharge, or tenderness, rectovaginal septum  normal. Cervix normal in appearance, no cervical motion tenderness, no adnexal masses or tenderness.  Uterus normal size, shape, mobile, regular contours, nontender.  Rectal:    Normal external sphincter.  No hemorrhoids appreciated. Internal exam not done.   Extremities:   Extremities normal, atraumatic, no cyanosis or edema  Pulses:   2+ and symmetric  all extremities  Skin:   Skin color, texture, turgor normal, no rashes or lesions  Lymph nodes:   Cervical, supraclavicular, and axillary nodes normal  Neurologic:   CNII-XII intact, normal strength, sensation and reflexes throughout   .  Labs:  Lab Results  Component Value Date   WBC 11.2 (H) 10/27/2022   HGB 10.2 (L) 10/27/2022   HCT 32.7 (L) 10/27/2022   MCV 85.2 10/27/2022   PLT 191 10/27/2022    Lab Results  Component Value Date   CREATININE 0.44 09/21/2020   BUN 6 09/21/2020   NA 135 09/21/2020   K 3.5 09/21/2020   CL 106 09/21/2020   CO2 20 (L) 09/21/2020    Lab Results  Component Value Date   ALT 15 09/21/2020   AST 18 09/21/2020   ALKPHOS 89 09/21/2020   BILITOT 0.6 09/21/2020    No results found for: "TSH"   Assessment:   1. Screen for STD (sexually transmitted disease)   2. Cervical cancer screening   3. Encounter for well woman exam with routine gynecological exam      Plan:  Blood tests: {blood tests:13147}. Breast self exam technique reviewed and patient encouraged to perform self-exam monthly. Contraception: OCP  (estrogen/progesterone). Discussed healthy lifestyle modifications. Mammogram : Not age appropriate Pap smear {discussed/ordered:14545}. Flu vaccine: Follow up in 1 year for annual exam   Stephane Ee, MD [facility]

## 2023-12-09 ENCOUNTER — Other Ambulatory Visit (HOSPITAL_COMMUNITY)
Admission: RE | Admit: 2023-12-09 | Discharge: 2023-12-09 | Disposition: A | Discharge status: Home / Self Care | Payer: PRIVATE HEALTH INSURANCE | Source: Ambulatory Visit | Attending: Obstetrics & Gynecology | Admitting: Obstetrics & Gynecology

## 2023-12-09 ENCOUNTER — Ambulatory Visit (INDEPENDENT_AMBULATORY_CARE_PROVIDER_SITE_OTHER): Payer: PRIVATE HEALTH INSURANCE | Admitting: Obstetrics & Gynecology

## 2023-12-09 VITALS — BP 94/47 | HR 65 | Resp 16 | Ht 63.0 in | Wt 180.4 lb

## 2023-12-09 DIAGNOSIS — Z124 Encounter for screening for malignant neoplasm of cervix: Secondary | ICD-10-CM

## 2023-12-09 DIAGNOSIS — Z0000 Encounter for general adult medical examination without abnormal findings: Secondary | ICD-10-CM

## 2023-12-09 DIAGNOSIS — Z113 Encounter for screening for infections with a predominantly sexual mode of transmission: Secondary | ICD-10-CM

## 2023-12-09 DIAGNOSIS — Z01419 Encounter for gynecological examination (general) (routine) without abnormal findings: Secondary | ICD-10-CM

## 2023-12-09 MED ORDER — NORETHIN ACE-ETH ESTRAD-FE 1-20 MG-MCG(24) PO TABS: 1.0000 | ORAL_TABLET | Freq: Every day | ORAL | 5 refills | Status: AC

## 2023-12-10 LAB — LIPID PANEL
Chol/HDL Ratio: 3.5 ratio (ref 0.0–4.4)
Cholesterol, Total: 150 mg/dL (ref 100–199)
HDL: 43 mg/dL (ref >39)
LDL Chol Calc (NIH): 94 mg/dL (ref 0–99)
Triglycerides: 63 mg/dL (ref 0–149)
VLDL Cholesterol Cal: 13 mg/dL (ref 5–40)

## 2023-12-10 LAB — COMPREHENSIVE METABOLIC PANEL WITH GFR
ALT: 15 IU/L (ref 0–32)
AST: 17 IU/L (ref 0–40)
Albumin: 4.4 g/dL (ref 4.0–5.0)
Alkaline Phosphatase: 38 IU/L — ABNORMAL LOW (ref 44–121)
BUN/Creatinine Ratio: 16 (ref 9–23)
BUN: 9 mg/dL (ref 6–20)
Bilirubin Total: 0.3 mg/dL (ref 0.0–1.2)
CO2: 19 mmol/L — ABNORMAL LOW (ref 20–29)
Calcium: 8.7 mg/dL (ref 8.7–10.2)
Chloride: 104 mmol/L (ref 96–106)
Creatinine, Ser: 0.56 mg/dL — ABNORMAL LOW (ref 0.57–1.00)
Globulin, Total: 2.2 g/dL (ref 1.5–4.5)
Glucose: 92 mg/dL (ref 70–99)
Potassium: 4.3 mmol/L (ref 3.5–5.2)
Sodium: 137 mmol/L (ref 134–144)
Total Protein: 6.6 g/dL (ref 6.0–8.5)
eGFR: 131 mL/min/{1.73_m2} (ref >59)

## 2023-12-10 LAB — CBC
Hematocrit: 39 % (ref 34.0–46.6)
Hemoglobin: 12.7 g/dL (ref 11.1–15.9)
MCH: 29 pg (ref 26.6–33.0)
MCHC: 32.6 g/dL (ref 31.5–35.7)
MCV: 89 fL (ref 79–97)
Platelets: 241 10*3/uL (ref 150–450)
RBC: 4.38 x10E6/uL (ref 3.77–5.28)
RDW: 12.3 % (ref 11.7–15.4)
WBC: 5.7 10*3/uL (ref 3.4–10.8)

## 2023-12-10 LAB — TSH+FREE T4
Free T4: 1.01 ng/dL (ref 0.82–1.77)
TSH: 2.12 u[IU]/mL (ref 0.450–4.500)

## 2023-12-10 LAB — HEMOGLOBIN A1C
Est. average glucose Bld gHb Est-mCnc: 97 mg/dL
Hgb A1c MFr Bld: 5 % (ref 4.8–5.6)

## 2023-12-10 LAB — VITAMIN D 25 HYDROXY (VIT D DEFICIENCY, FRACTURES): Vit D, 25-Hydroxy: 20.7 ng/mL — ABNORMAL LOW (ref 30.0–100.0)

## 2023-12-11 LAB — CYTOLOGY - PAP
Diagnosis: NEGATIVE
Diagnosis: REACTIVE

## 2023-12-12 ENCOUNTER — Encounter: Payer: Self-pay | Admitting: Obstetrics & Gynecology

## 2023-12-12 ENCOUNTER — Other Ambulatory Visit: Payer: Self-pay | Admitting: Obstetrics & Gynecology

## 2023-12-12 DIAGNOSIS — E559 Vitamin D deficiency, unspecified: Secondary | ICD-10-CM

## 2023-12-12 MED ORDER — VITAMIN D (ERGOCALCIFEROL) 1.25 MG (50000 UNIT) PO CAPS: 50000.0000 [IU] | ORAL_CAPSULE | ORAL | 0 refills | Status: AC

## 2023-12-12 NOTE — Progress Notes (Signed)
 Vit d prescribed to treat deficiency.
